# Patient Record
Sex: Male | Born: 1940 | Race: White | Hispanic: No | Marital: Married | Smoking: Former smoker
Health system: Southern US, Community
[De-identification: ages and names within clinical notes are randomized; demographics above are authoritative.]

## PROBLEM LIST (undated history)

## (undated) DIAGNOSIS — M2550 Pain in unspecified joint: Secondary | ICD-10-CM

## (undated) DIAGNOSIS — I714 Abdominal aortic aneurysm, without rupture, unspecified: Secondary | ICD-10-CM

## (undated) DIAGNOSIS — R519 Headache, unspecified: Secondary | ICD-10-CM

## (undated) DIAGNOSIS — IMO0001 Reserved for inherently not codable concepts without codable children: Secondary | ICD-10-CM

## (undated) DIAGNOSIS — M1990 Unspecified osteoarthritis, unspecified site: Secondary | ICD-10-CM

## (undated) DIAGNOSIS — R51 Headache: Secondary | ICD-10-CM

## (undated) DIAGNOSIS — I82409 Acute embolism and thrombosis of unspecified deep veins of unspecified lower extremity: Secondary | ICD-10-CM

## (undated) DIAGNOSIS — E785 Hyperlipidemia, unspecified: Secondary | ICD-10-CM

## (undated) DIAGNOSIS — J449 Chronic obstructive pulmonary disease, unspecified: Secondary | ICD-10-CM

## (undated) DIAGNOSIS — R42 Dizziness and giddiness: Secondary | ICD-10-CM

## (undated) HISTORY — DX: Abdominal aortic aneurysm, without rupture, unspecified: I71.40

## (undated) HISTORY — DX: Chronic obstructive pulmonary disease, unspecified: J44.9

## (undated) HISTORY — DX: Pain in unspecified joint: M25.50

## (undated) HISTORY — DX: Acute embolism and thrombosis of unspecified deep veins of unspecified lower extremity: I82.409

## (undated) HISTORY — DX: Hyperlipidemia, unspecified: E78.5

## (undated) HISTORY — DX: Abdominal aortic aneurysm, without rupture: I71.4

---

## 2001-06-17 ENCOUNTER — Emergency Department (HOSPITAL_COMMUNITY): Admission: EM | Admit: 2001-06-17 | Discharge: 2001-06-17 | Payer: Self-pay | Admitting: Emergency Medicine

## 2001-06-17 ENCOUNTER — Encounter: Payer: Self-pay | Admitting: Emergency Medicine

## 2001-08-27 ENCOUNTER — Emergency Department (HOSPITAL_COMMUNITY): Admission: RE | Admit: 2001-08-27 | Discharge: 2001-08-27 | Payer: Self-pay | Admitting: *Deleted

## 2003-07-15 ENCOUNTER — Inpatient Hospital Stay (HOSPITAL_COMMUNITY): Admission: EM | Admit: 2003-07-15 | Discharge: 2003-07-16 | Payer: Self-pay | Admitting: Emergency Medicine

## 2003-07-15 ENCOUNTER — Encounter: Payer: Self-pay | Admitting: Emergency Medicine

## 2003-12-04 DIAGNOSIS — I82409 Acute embolism and thrombosis of unspecified deep veins of unspecified lower extremity: Secondary | ICD-10-CM

## 2003-12-04 HISTORY — PX: FRACTURE SURGERY: SHX138

## 2003-12-04 HISTORY — DX: Acute embolism and thrombosis of unspecified deep veins of unspecified lower extremity: I82.409

## 2007-10-22 ENCOUNTER — Ambulatory Visit: Payer: Self-pay | Admitting: Vascular Surgery

## 2008-10-20 ENCOUNTER — Ambulatory Visit: Payer: Self-pay | Admitting: Vascular Surgery

## 2009-10-26 ENCOUNTER — Ambulatory Visit: Payer: Self-pay | Admitting: Vascular Surgery

## 2010-12-01 ENCOUNTER — Ambulatory Visit: Payer: Self-pay | Admitting: Vascular Surgery

## 2011-04-17 NOTE — Procedures (Signed)
DUPLEX ULTRASOUND OF ABDOMINAL AORTA   INDICATION:  Follow up abdominal aortic aneurysm   HISTORY:  Diabetes:  No  Cardiac:  No  Hypertension:  No  Smoking:  Yes  Connective Tissue Disorder:  Family History:  No  Previous Surgery:  No   DUPLEX EXAM:         AP (cm)                   TRANSVERSE (cm)  Proximal             1.84 cm                   1.80 cm  Mid                  3.03 cm                   3.06 cm  Distal               2.01 cm                   1.99 cm  Right Iliac          0.84 cm  Left Iliac           0.85 cm   PREVIOUS:  Date: 10/11/2006  AP:  2.9  TRANSVERSE:  2.7   IMPRESSION:  Fairly stable abdominal aortic aneurysm with an increase  from 2.9 cm in previous study to 3.06 cm today.   ___________________________________________  Janetta Hora. Fields, MD   AS/MEDQ  D:  10/22/2007  T:  10/23/2007  Job:  [id number]

## 2011-04-17 NOTE — Procedures (Signed)
DUPLEX ULTRASOUND OF ABDOMINAL AORTA   INDICATION:  Follow up abdominal aortic aneurysm.   HISTORY:  Diabetes:  No.  Cardiac:  No.  Hypertension:  No.  Smoking:  Yes.  Connective Tissue Disorder:  Family History:  No.  Previous Surgery:  No.   DUPLEX EXAM:         AP (cm)                   TRANSVERSE (cm)  Proximal             2.09 Cm                   2.12 cm  Mid                  3.25 cm                   3.28 cm  Distal               1.90 cm                   1.92 cm  Right Iliac          0.99 cm  Left Iliac           1.02 cm   PREVIOUS:  Date: 10/22/2007  AP:  3.03  TRANSVERSE:  3.06   IMPRESSION:  Abdominal aortic aneurysm appears stable with the largest  measurement of 3.25 cm X 3.28 cm.   ___________________________________________  Janetta Hora. Fields, MD   AS/MEDQ  D:  10/20/2008  T:  10/20/2008  Job:  [id number]

## 2011-04-17 NOTE — Procedures (Signed)
DUPLEX ULTRASOUND OF ABDOMINAL AORTA   INDICATION:  Followup abdominal aortic aneurysm.   HISTORY:  Diabetes:  No.  Cardiac:  No.  Hypertension:  No.  Smoking:  Yes.  Connective Tissue Disorder:  Family History:  No.  Previous Surgery:  No.   DUPLEX EXAM:         AP (cm)                   TRANSVERSE (cm)  Proximal             2.06 cm                   2.31 cm  Mid                  3.46 cm                   3.28 cm  Distal               3.37 cm                   3.56 cm  Right Iliac          1.35 cm                   1.47 cm  Left Iliac           1.04 cm                   1.07 cm   PREVIOUS:  Date:  10/26/2009  AP:  3.9  TRANSVERSE:  3.4   IMPRESSION:  Abdominal aortic aneurysm noted with largest measurement of  3.37 x 3.56 cm.   ___________________________________________  Janetta Hora. Darrick Penna, MD   EM/MEDQ  D:  12/01/2010  T:  12/01/2010  Job:  [id number]

## 2011-04-17 NOTE — Procedures (Signed)
DUPLEX ULTRASOUND OF ABDOMINAL AORTA   INDICATION:  Followup of abdominal aortic aneurysm.   HISTORY:  Diabetes:  No.  Cardiac:  No.  Hypertension:  No.  Smoking:  Yes.  Connective Tissue Disorder:  Family History:  No.  Previous Surgery:  No.   DUPLEX EXAM:         AP (cm)                   TRANSVERSE (cm)  Proximal             2.9 cm                    2.7 cm  Mid                  3.9 cm                    3.4 cm  Distal               3.2 cm                    3.3 cm  Right Iliac          1.3 cm                    1.05 cm  Left Iliac           1.01 cm                   1.03 cm   PREVIOUS:  Date:  AP:  3.25  TRANSVERSE:  3.28   IMPRESSION:  An abdominal aortic aneurysm with largest measurement 3.9 x  3.4 cm.   ___________________________________________  Janetta Hora. Fields, MD   CJ/MEDQ  D:  10/26/2009  T:  10/26/2009  Job:  [id number]

## 2011-04-20 NOTE — H&P (Signed)
NAME:  Reginald [name], Reginald [name] NO.:  [id number]   MEDICAL RECORD NO.:  [id number]                   PATIENT TYPE:  EMS   LOCATION:  [facility]                                 FACILITY:  [facility]   PHYSICIAN:  Wanda Plump, MD [facility]                 DATE OF BIRTH:  18-Jan-1941   DATE OF ADMISSION:  07/15/2003  DATE OF DISCHARGE:                                HISTORY & PHYSICAL   ATTENDING PHYSICIAN:  Wanda Plump, M.D.  [facility]   PRIMARY CARE PHYSICIAN:  Valetta Mole. Swords, M.D.  [facility]   CHIEF COMPLAINT:  Shortness of breath.   HISTORY OF PRESENT ILLNESS:  Reginald [name] is a 70 year old white male who was  admitted to [facility] ER with acute shortness of breath.  The patient  stated that he was having a cold for the last few days characterized by  cough, yellow sputum, and low grade temperature.  Earlier today at 12:30  a.m. he had a sudden episode of severe shortness of breath.  EMS was called  and he was found to be wheezing.  On his way to the hospital, he got two  albuterol treatments and nitroglycerin.  By the time he arrived at the  emergency room, he was feeling better.  He specifically denies any chest  pain to me.  At present, he feels overall better but feels he is very  congested in his chest.   PAST MEDICAL HISTORY:  1. He had an ankle fracture three years ago followed by right lower     extremity DVT.  After that he was left with chronic right leg swelling.  2. He has never been officially diagnosed with diabetes, hypertension, or     COPD.   CURRENT MEDICATIONS:  None.   SOCIAL HISTORY:  He smokes one pack a day of tobacco and does not drink.   FAMILY HISTORY:  Noncontributory at this time.   REVIEW OF SYSTEMS:  Again, he is having a cold for the last few days.  He  did have some sore throat the first day of the cold.  No nausea, vomiting,  diarrhea.  No abdominal pain.  No hematuria.  He is coughing again some  yellow sputum.   ALLERGIES:  No known drug  allergies.   PHYSICAL EXAMINATION:  GENERAL:  The patient is alert and oriented.  He has  some mild respiratory distress.  He is obviously wheezing and coughing.  VITAL SIGNS:  Afebrile, respirations 20, pulse 77, blood pressure 90/61, O2  saturation 94% on 2 L per nasal cannula.  HEENT:  Ear:  Tympanic membrane on the right side is slightly bulging.  The  left is normal.  Oropharynx is normal.  Sinuses nontender.  LUNGS:  He has diffuse wheezes and rhonchi.  CARDIOVASCULAR:  Regular rate and rhythm.  ABDOMEN:  Nondistended, soft with good bowel sounds and  no organomegaly.  EXTREMITIES:  The right calf is slightly bigger than the left but no pitting  edema.   LABORATORY DATA:  ABGs show pH 7.4, CO2 35, bicarb 22, and O2 was low at 59.  CBC shows white count 9.6, normal diff, hemoglobin 15.8, platelets 213.  LFTs are normal.  BNP is less than 30.  Creatinine 1, BUN 13, blood sugar  142, potassium 3.6, sodium 136.  First set of cardiac enzymes show CK 213  which is elevated, MB 8.4 which is elevated, and negative Troponin.   CT of the chest and legs shows no evidence of DVT or pulmonary embolism.  He  does have COPD, probably early alveolitis at the bases but no obvious  consolidation.  Chest x-ray shows COPD.   ASSESSMENT/PLAN:  1. Reginald [name] has been admitted with an acute episode of dyspnea and in light     of the negative CT of the chest and legs, this is most likely     bronchospasm and a chronic obstructive pulmonary disease exacerbation.     He may be developing a pneumonia as well.  I am planning to admit him to     the hospital for intravenous antibiotics and aggressive pulmonary toilet     as well as oxygen supplementation.  2. He has a slightly elevated CK and we will check serial cardiac enzymes.  3. Will provide deep venous thrombosis prophylaxis with Lovenox     subcutaneously.                                                Wanda Plump, MD [facility]    JEP/MEDQ  D:   07/15/2003  T:  07/15/2003  Job:  [id number]

## 2011-04-20 NOTE — Discharge Summary (Signed)
   NAME:  Reginald [name], Reginald [name] NO.:  [id number]   MEDICAL RECORD NO.:  [id number]                   PATIENT TYPE:  INP   LOCATION:  5740                                 FACILITY:  [facility]   PHYSICIAN:  Rene Paci, M.D. [facility]          DATE OF BIRTH:  Feb 09, 1941   DATE OF ADMISSION:  07/15/2003  DATE OF DISCHARGE:  07/16/2003                                 DISCHARGE SUMMARY   DISCHARGE DIAGNOSES:  1. Dyspnea.  2. Bronchospasm.  3. Bronchitis.   BRIEF HISTORY:  Reginald [name] is a 70 year old white male who complained of an  upper respiratory infection starting last Sunday.  The patient's symptoms  have progressed including headache and productive cough.  He developed  sudden onset of shortness of breath at 12:30 a.m. on the day of admission.  EMS was called.  He was noted to be wheezing.  He received albuterol x 2 and  nitroglycerin prior to arrival to the emergency room.  In the emergency  department, he did feel improved.   PAST MEDICAL HISTORY:  Remote history of ankle fracture associated with DVT  at that time.   HOSPITAL COURSE:  PULMONARY AND DYSPNEA:  The patient was admitted with acute dyspnea felt to  be likely secondary to bronchospasm from probable underlying COPD.  However,  the patient did have a CT of the chest which was negative for PE, congestive  heart failure, or infiltrates.  He does have some evidence of scarring on  the bilateral bases.  The patient states that he does still smoke.  He has  not smoked since the  Sunday prior to this admission, however, The patient  was treated with albuterol, Atrovent nebulizers as well as antibiotics for  probable underlying bronchitis.  Currently, the patient is maintaining O2  saturation of 91% on room air and is symptomatically improved.  The patient  is interested in smoking cessation.  He states he will talk with Dr. Cato Mulligan  next week about possibly using Wellbutrin.   LABORATORY DATA:  At  discharge, CMET was essentially normal.   DISCHARGE MEDICATIONS:  1. Avalox 400 mg daily for 8 days.  2. Guaifenesin 600 mg 2 tablets q.12h. for 5 days.  3. Albuterol nebulizer with Atrovent four times a day for 5 days.   FOLLOW UP:  Dr. Cato Mulligan Tuesday, August 17, at 10:45 a.m.      Cornell Barman, P.A. [facility]                  Rene Paci, M.D. [facility]   LC/MEDQ  D:  07/16/2003  T:  07/17/2003  Job:  [id number]   cc:   Valetta Mole. Swords, M.D. [facility]

## 2011-05-24 ENCOUNTER — Ambulatory Visit: Payer: Self-pay | Admitting: Vascular Surgery

## 2011-11-01 ENCOUNTER — Encounter: Payer: Self-pay | Admitting: Vascular Surgery

## 2011-11-29 ENCOUNTER — Ambulatory Visit (INDEPENDENT_AMBULATORY_CARE_PROVIDER_SITE_OTHER): Payer: Medicare Other | Admitting: Vascular Surgery

## 2011-11-29 ENCOUNTER — Encounter: Payer: Self-pay | Admitting: Vascular Surgery

## 2011-11-29 ENCOUNTER — Encounter (INDEPENDENT_AMBULATORY_CARE_PROVIDER_SITE_OTHER): Payer: Medicare Other | Admitting: *Deleted

## 2011-11-29 VITALS — BP 133/83 | HR 67 | Resp 16 | Ht 72.0 in | Wt 205.0 lb

## 2011-11-29 DIAGNOSIS — I714 Abdominal aortic aneurysm, without rupture: Secondary | ICD-10-CM

## 2011-11-29 NOTE — Progress Notes (Signed)
[facility] [place] HISTORY AND PHYSICAL   History of Present Illness:  Patient is a 70 y.o. year old male who presents for follow-up evaluation of AAA.  The patient denies new abdominal or back pain.  The patient's atherosclerotic risk factors remain hypertension, COPD, smoking.  These are all currently stable and followed by his primary care physician.  The patients AAA was 2.9cm  on intial evaluation.  Past Medical History  Diagnosis Date  . COPD (chronic obstructive pulmonary disease)   . Joint pain   . Bronchitis   . Productive cough   . Asthma   . Blood clot in vein   . AAA (abdominal aortic aneurysm)   . Hypertension     History reviewed. No pertinent past surgical history.  Review of Systems:  Neurologic: denies symptoms of TIA, amaurosis, or stroke Cardiac:denies shortness of breath or chest pain Pulmonary: denies cough or wheeze  Social History History  Substance Use Topics  . Smoking status: Current Everyday Smoker -- 0.5 packs/day    Types: Cigarettes  . Smokeless tobacco: Not on file  . Alcohol Use: No    Allergies  Not on File   Current Outpatient Prescriptions  Medication Sig Dispense Refill  . albuterol (PROVENTIL) (2.5 MG/3ML) 0.083% nebulizer solution Take 2.5 mg by nebulization every 6 (six) hours as needed.        Marland Kitchen atorvastatin (LIPITOR) 10 MG tablet Take 20 mg by mouth daily.        Marland Kitchen doxazosin (CARDURA) 4 MG tablet Take 4 mg by mouth at bedtime.        . finasteride (PROPECIA) 1 MG tablet Take 5 mg by mouth daily.        . fluticasone (FLONASE) 50 MCG/ACT nasal spray Place 2 sprays into the nose daily.        . fluticasone (FLOVENT HFA) 44 MCG/ACT inhaler Inhale 1 puff into the lungs 2 (two) times daily.        Marland Kitchen gemfibrozil (LOPID) 600 MG tablet Take 600 mg by mouth 2 (two) times daily before a meal.        . hydrocodone-acetaminophen (LORCET-HD) 5-500 MG per capsule Take 1 capsule by mouth every 4 (four) hours as needed.           Physical Examination  Filed Vitals:   11/29/11 1130  BP: 133/83  Pulse: 67  Resp: 16  Height: 6' (1.829 m)  Weight: 205 lb (92.987 kg)  SpO2: 97%    Body mass index is 27.80 kg/(m^2).  General:  Alert and oriented, no acute distress HEENT: Normal Neck: No bruit or JVD Pulmonary: Clear to auscultation bilaterally Cardiac: Regular Rate and Rhythm without murmur Abdomen: Soft, non-tender, non-distended, normal bowel sounds, vaguely pulsatile mass in epigastrium  DATA: Patient had a repeat aortic ultrasound today which shows aneurysm diameter is currently 3.8 cm in diameter compared to 3.56 cm in diameter one year ago   ASSESSMENT: Asymptomatic small abdominal aortic aneurysm with minimal growth   PLAN: He will have a followup aortic ultrasound in one year. If the aorta is greater than 4 cm at that time we will go to every 6 months. He will see me in 2 years.  Fabienne Bruns, MD [facility] [place] Office: [phone] Pager: [phone]

## 2011-12-14 NOTE — Procedures (Unsigned)
DUPLEX ULTRASOUND OF ABDOMINAL AORTA  INDICATION:  Abdominal aortic aneurysm.  HISTORY: Diabetes:  No. Cardiac:  No. Hypertension:  No. Smoking:  Yes. Connective Tissue Disorder: Family History:  No. Previous Surgery:  No.  DUPLEX EXAM:         AP (cm)                   TRANSVERSE (cm) Proximal             Not visualized            Not visualized Mid                  2.0 cm                    2.2 cm Distal               3.6 cm                    3.8 cm Right Iliac          Not visualized            Not visualized Left Iliac           Not visualized            Not visualized  PREVIOUS:  Date: 12/01/2010  AP:  3.37  TRANSVERSE:  3.56  IMPRESSION: 1. Aneurysmal dilatation of the distal abdominal aorta with no     significant change in maximal diameter when compared to the     previous examination on 12/01/2010. 2. Unable to adequately visualize the proximal abdominal aorta and     bilateral common iliac arteries due to overlying bowel gas     patterns.  The patient was not n.p.o. for this examination.  ___________________________________________ Janetta Hora Fields, MD  CH/MEDQ  D:  11/30/2011  T:  11/30/2011  Job:  [id number]

## 2012-11-25 ENCOUNTER — Other Ambulatory Visit: Payer: Self-pay | Admitting: *Deleted

## 2012-11-25 DIAGNOSIS — I714 Abdominal aortic aneurysm, without rupture: Secondary | ICD-10-CM

## 2012-11-27 ENCOUNTER — Encounter: Payer: Self-pay | Admitting: Neurosurgery

## 2012-11-28 ENCOUNTER — Encounter: Payer: Self-pay | Admitting: Neurosurgery

## 2012-11-28 ENCOUNTER — Encounter (INDEPENDENT_AMBULATORY_CARE_PROVIDER_SITE_OTHER): Payer: Medicare Other | Admitting: *Deleted

## 2012-11-28 ENCOUNTER — Ambulatory Visit (INDEPENDENT_AMBULATORY_CARE_PROVIDER_SITE_OTHER): Payer: Medicare Other | Admitting: Neurosurgery

## 2012-11-28 VITALS — BP 139/85 | HR 63 | Resp 16 | Ht 70.5 in | Wt 197.0 lb

## 2012-11-28 DIAGNOSIS — I714 Abdominal aortic aneurysm, without rupture, unspecified: Secondary | ICD-10-CM

## 2012-11-28 NOTE — Addendum Note (Signed)
Addended by: Sharee Pimple on: 11/28/2012 11:01 AM   Modules accepted: Orders

## 2012-11-28 NOTE — Progress Notes (Signed)
[facility] [place] AAA/Carotid Office Note  CC: AAA surveillance Referring Physician: Fields  History of Present Illness: 71 year old male patient of Dr. Darrick Penna with known AAA. The patient has no history of intervention. The patient denies any unusual abdominal or back pain. The patient denies any new medical diagnoses or recent surgery.  Past Medical History  Diagnosis Date  . COPD (chronic obstructive pulmonary disease)   . Joint pain   . Bronchitis   . Productive cough   . Asthma   . Blood clot in vein   . AAA (abdominal aortic aneurysm)   . Hypertension   . DVT (deep venous thrombosis) 2005    Right  leg    ROS: [x]  Positive   [ ]  Denies    General: [ ]  Weight loss, [ ]  Fever, [ ]  chills Neurologic: [ ]  Dizziness, [ ]  Blackouts, [ ]  Seizure [ ]  Stroke, [ ]  "Mini stroke", [ ]  Slurred speech, [ ]  Temporary blindness; [ ]  weakness in arms or legs, [ ]  Hoarseness Cardiac: [ ]  Chest pain/pressure, [ ]  Shortness of breath at rest [ ]  Shortness of breath with exertion, [ ]  Atrial fibrillation or irregular heartbeat Vascular: [ ]  Pain in legs with walking, [ ]  Pain in legs at rest, [ ]  Pain in legs at night,  [ ]  Non-healing ulcer, [ ]  Blood clot in vein/DVT,   Pulmonary: [ ]  Home oxygen, [ ]  Productive cough, [ ]  Coughing up blood, [ ]  Asthma,  [ ]  Wheezing Musculoskeletal:  [ ]  Arthritis, [ ]  Low back pain, [ ]  Joint pain Hematologic: [ ]  Easy Bruising, [ ]  Anemia; [ ]  Hepatitis Gastrointestinal: [ ]  Blood in stool, [ ]  Gastroesophageal Reflux/heartburn, [ ]  Trouble swallowing Urinary: [ ]  chronic Kidney disease, [ ]  on HD - [ ]  MWF or [ ]  TTHS, [ ]  Burning with urination, [ ]  Difficulty urinating Skin: [ ]  Rashes, [ ]  Wounds Psychological: [ ]  Anxiety, [ ]  Depression   Social History History  Substance Use Topics  . Smoking status: Current Every Day Smoker -- 0.5 packs/day    Types: Cigarettes  . Smokeless tobacco: Never Used  . Alcohol Use: No     Family History Family History  Problem Relation Age of Onset  . Hyperlipidemia Mother   . Hypertension Mother   . Cancer Father   . Hyperlipidemia Father   . Hypertension Father   . Hyperlipidemia Sister   . Hypertension Sister   . Heart disease Brother     Heart Disease before age 3  . Heart attack Brother     x's 2  . Hyperlipidemia Brother   . Hypertension Brother     Not on File  Current Outpatient Prescriptions  Medication Sig Dispense Refill  . albuterol (PROVENTIL) (2.5 MG/3ML) 0.083% nebulizer solution Take 2.5 mg by nebulization every 6 (six) hours as needed.        Marland Kitchen aspirin 325 MG tablet Take 325 mg by mouth every morning.      Marland Kitchen atorvastatin (LIPITOR) 10 MG tablet Take 20 mg by mouth daily.        . clobetasol cream (TEMOVATE) 0.05 % as needed.      . doxazosin (CARDURA) 4 MG tablet Take 4 mg by mouth at bedtime.        . finasteride (PROPECIA) 1 MG tablet Take 5 mg by mouth daily.        . finasteride (PROSCAR) 5 MG tablet daily.      Marland Kitchen  fluticasone (FLONASE) 50 MCG/ACT nasal spray Place 2 sprays into the nose daily.        . fluticasone (FLOVENT HFA) 44 MCG/ACT inhaler Inhale 2 puffs into the lungs at bedtime.       Marland Kitchen gemfibrozil (LOPID) 600 MG tablet Take 600 mg by mouth 2 (two) times daily before a meal.        . hydrocodone-acetaminophen (LORCET-HD) 5-500 MG per capsule Take 1 capsule by mouth every 4 (four) hours as needed.          Physical Examination  Filed Vitals:   11/28/12 0936  BP: 139/85  Pulse: 63  Resp: 16    Body mass index is 27.87 kg/(m^2).  General:  WDWN in NAD Gait: Normal HEENT: WNL Eyes: Pupils equal Pulmonary: normal non-labored breathing , without Rales, rhonchi,  wheezing Cardiac: RRR, without  Murmurs, rubs or gallops; Abdomen: soft, NT, no masses Skin: no rashes, ulcers noted  Vascular Exam Pulses: Palpable femoral and lower extremity pulses bilaterally Carotid bruits: Carotid pulses to auscultation no bruits are  heard, no abdominal mass is palpated Extremities without ischemic changes, no Gangrene , no cellulitis; no open wounds;  Musculoskeletal: no muscle wasting or atrophy   Neurologic: A&O X 3; Appropriate Affect ; SENSATION: normal; MOTOR FUNCTION:  moving all extremities equally. Speech is fluent/normal  Non-Invasive Vascular Imaging AAA duplex today shows a maximum diameter of 4.2 AP by 3.6 transverse, previous exam one year ago was 3.6 x 3.8.  ASSESSMENT/PLAN: Slight increase in AAA diameter that is asymptomatic, the patient will followup in one year with repeat AAA duplex. The patient knows the signs and symptoms of rupture and knows to call 911 should that occur.  Lauree Chandler ANP   Clinic MD: Imogene Burn

## 2012-12-01 ENCOUNTER — Emergency Department (HOSPITAL_COMMUNITY)
Admission: EM | Admit: 2012-12-01 | Discharge: 2012-12-01 | Disposition: A | Discharge status: Home / Self Care | Payer: Medicare Other | Attending: Emergency Medicine | Admitting: Emergency Medicine

## 2012-12-01 ENCOUNTER — Emergency Department (HOSPITAL_COMMUNITY): Payer: Medicare Other

## 2012-12-01 ENCOUNTER — Encounter (HOSPITAL_COMMUNITY): Payer: Self-pay | Admitting: Emergency Medicine

## 2012-12-01 DIAGNOSIS — Z86718 Personal history of other venous thrombosis and embolism: Secondary | ICD-10-CM | POA: Insufficient documentation

## 2012-12-01 DIAGNOSIS — J449 Chronic obstructive pulmonary disease, unspecified: Secondary | ICD-10-CM | POA: Insufficient documentation

## 2012-12-01 DIAGNOSIS — F17200 Nicotine dependence, unspecified, uncomplicated: Secondary | ICD-10-CM | POA: Insufficient documentation

## 2012-12-01 DIAGNOSIS — J45909 Unspecified asthma, uncomplicated: Secondary | ICD-10-CM | POA: Insufficient documentation

## 2012-12-01 DIAGNOSIS — J4489 Other specified chronic obstructive pulmonary disease: Secondary | ICD-10-CM | POA: Insufficient documentation

## 2012-12-01 DIAGNOSIS — I10 Essential (primary) hypertension: Secondary | ICD-10-CM | POA: Insufficient documentation

## 2012-12-01 DIAGNOSIS — Z79899 Other long term (current) drug therapy: Secondary | ICD-10-CM | POA: Insufficient documentation

## 2012-12-01 DIAGNOSIS — R51 Headache: Secondary | ICD-10-CM | POA: Insufficient documentation

## 2012-12-01 MED ORDER — HYDROMORPHONE HCL 4 MG PO TABS: 4.0000 mg | ORAL_TABLET | ORAL | Status: DC | PRN

## 2012-12-01 MED ORDER — FENTANYL CITRATE 0.05 MG/ML IJ SOLN
100.0000 ug | Freq: Once | INTRAMUSCULAR | Status: AC
  Administered 2012-12-01: 100 ug via INTRAVENOUS
  Filled 2012-12-01: qty 2

## 2012-12-01 NOTE — ED Notes (Addendum)
Per EMS pt came from home where he had sudden onset of headache on the right side and tremors Pt does have history of cluster headaches. Pt has 20g in left hand. Pt was Sinus Tachy in route per EMS. Pt states that he has does have abdominal aortic aneurysm that was just checked on Tuesday last week.

## 2012-12-01 NOTE — ED Notes (Signed)
Patient transported to CT 

## 2012-12-01 NOTE — ED Notes (Signed)
Pt states cluster headaches move from front to back alternating back and forth.  Pt states that pain is stronger when in the front of the head than the posterior of head.

## 2012-12-01 NOTE — ED Provider Notes (Signed)
History     CSN: [id number]  Arrival date & time 12/01/12  1514   First MD Initiated Contact with Patient 12/01/12 1535      Chief Complaint  Patient presents with  . Headache    (Consider location/radiation/quality/duration/timing/severity/associated sxs/prior treatment) HPI This 71 year old male gets intermittent right-sided sudden sharp stabbing headaches the last less than 1 minute each but occur multiple times per hour into the frequency of occurring once every 2 months or so and lasting 1-2 days or he will have multiple headaches per hour over the course of 2 days or so and he will not have headaches again until about 2 months later. He has had this pattern for years and is followed by a doctor and was prescribed hydrocodone for the patient's headaches. Patient states he usually moderately severe. Today with her last 2 hours the patient had sudden onset of at least one dozen or more right-sided sharp stabbing headaches more severe than usual. Instead of being moderately severe they were severe. He has no associated symptoms and no radiation. He is no change in speech vision swallowing or understanding and no weakness numbness or incoordination. He also has no neck pain back pain chest pain shortness breath abdominal pain. She is no lightheadedness no vertigo no trauma. There is no treatment prior to arrival today other than taking 2 aspirin. Because his headaches were stronger than usual but the same location and without associated symptoms he came to the ED for evaluation. Past Medical History  Diagnosis Date  . COPD (chronic obstructive pulmonary disease)   . Joint pain   . Bronchitis   . Productive cough   . Asthma   . Blood clot in vein   . AAA (abdominal aortic aneurysm)   . Hypertension   . DVT (deep venous thrombosis) 2005    Right  leg    Past Surgical History  Procedure Date  . Fracture surgery 2005    Right heel     Family History  Problem Relation Age of Onset    . Hyperlipidemia Mother   . Hypertension Mother   . Cancer Father   . Hyperlipidemia Father   . Hypertension Father   . Hyperlipidemia Sister   . Hypertension Sister   . Heart disease Brother     Heart Disease before age 65  . Heart attack Brother     x's 2  . Hyperlipidemia Brother   . Hypertension Brother     History  Substance Use Topics  . Smoking status: Current Every Day Smoker -- 0.5 packs/day    Types: Cigarettes  . Smokeless tobacco: Never Used  . Alcohol Use: No      Review of Systems 10 Systems reviewed and are negative for acute change except as noted in the HPI. Allergies  Review of patient's allergies indicates no known allergies.  Home Medications   Current Outpatient Rx  Name  Route  Sig  Dispense  Refill  . ALBUTEROL SULFATE HFA 108 (90 BASE) MCG/ACT IN AERS   Inhalation   Inhale 2 puffs into the lungs every 6 (six) hours as needed. For shortness of breath.         . ASPIRIN 325 MG PO TABS   Oral   Take 325 mg by mouth every morning.         . ATORVASTATIN CALCIUM 20 MG PO TABS   Oral   Take 20 mg by mouth at bedtime.         Marland Kitchen  CLOBETASOL PROPIONATE 0.05 % EX CREA   Topical   Apply 1 application topically 2 (two) times daily as needed. For dry skin.         Marland Kitchen DOXAZOSIN MESYLATE 4 MG PO TABS   Oral   Take 4 mg by mouth at bedtime.          Marland Kitchen FINASTERIDE 5 MG PO TABS   Oral   Take 5 mg by mouth at bedtime.          Marland Kitchen FLUTICASONE PROPIONATE 50 MCG/ACT NA SUSP   Nasal   Place 2 sprays into the nose daily.           Marland Kitchen FLUTICASONE PROPIONATE  HFA 44 MCG/ACT IN AERO   Inhalation   Inhale 2 puffs into the lungs at bedtime.          Marland Kitchen GEMFIBROZIL 600 MG PO TABS   Oral   Take 600 mg by mouth 2 (two) times daily.          Marland Kitchen HYDROCODONE-ACETAMINOPHEN 5-500 MG PO TABS   Oral   Take 1 tablet by mouth every 6 (six) hours as needed. For pain.         Marland Kitchen HYDROMORPHONE HCL 4 MG PO TABS   Oral   Take 1 tablet (4 mg total)  by mouth every 4 (four) hours as needed for pain.   6 tablet   0     BP 110/63  Pulse 95  Temp 98.1 F (36.7 C) (Oral)  Resp 19  SpO2 96%  Physical Exam  Nursing note and vitals reviewed. Constitutional:       Awake, alert, nontoxic appearance with baseline speech for patient.  HENT:  Head: Atraumatic.  Mouth/Throat: No oropharyngeal exudate.  Eyes: EOM are normal. Pupils are equal, round, and reactive to light. Right eye exhibits no discharge. Left eye exhibits no discharge.  Neck: Neck supple.  Cardiovascular: Normal rate and regular rhythm.   No murmur heard. Pulmonary/Chest: Effort normal and breath sounds normal. No stridor. No respiratory distress. He has no wheezes. He has no rales. He exhibits no tenderness.  Abdominal: Soft. Bowel sounds are normal. He exhibits no mass. There is no tenderness. There is no rebound.  Musculoskeletal: He exhibits no edema and no tenderness.       Baseline ROM, moves extremities with no obvious new focal weakness.  Lymphadenopathy:    He has no cervical adenopathy.  Neurological: He is alert.       Awake, alert, cooperative and aware of situation; motor strength bilaterally; sensation normal to light touch bilaterally; peripheral visual fields full to confrontation; no facial asymmetry; tongue midline; major cranial nerves appear intact; no pronator drift, normal finger to nose bilaterally  Skin: No rash noted.  Psychiatric: He has a normal mood and affect.    ED Course  Procedures (including critical care time) Normal gait in ED. Patient / Family / Caregiver informed of clinical course, understand medical decision-making process, and agree with plan. Labs Reviewed - No data to display Ct Head Wo Contrast  12/01/2012  *RADIOLOGY REPORT*  Clinical Data: Severe headache  CT HEAD WITHOUT CONTRAST  Technique:  Contiguous axial images were obtained from the base of the skull through the vertex without contrast.  Comparison: None.  Findings:  There is extensive patchy and confluent low density affecting the cerebral hemispheric white matter most likely indicative of chronic small vessel disease.  There is an old cortical infarction in the insular region on  the left.  A small acute or subacute infarction could be hidden amongst these extensive chronic changes.  No evidence of mass lesion, hemorrhage, hydrocephalus or extra-axial collection.  No calvarial abnormality. Sinuses, middle ears and mastoids are clear.  IMPRESSION: Extensive chronic appearing small vessel ischemic changes throughout the cerebral hemispheric white matter.  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  No identifiably acute finding.   Original Report Authenticated By: Paulina Fusi, M.D.      1. Headache       MDM  I doubt any other EMC precluding discharge at this time including, but not necessarily limited to the following:SAH, CVA.        Hurman Horn, MD 12/02/12 1225

## 2013-06-18 ENCOUNTER — Other Ambulatory Visit: Payer: Self-pay | Admitting: Family Medicine

## 2013-06-18 DIAGNOSIS — R109 Unspecified abdominal pain: Secondary | ICD-10-CM

## 2013-06-24 ENCOUNTER — Other Ambulatory Visit: Payer: Medicare Other

## 2013-12-10 ENCOUNTER — Other Ambulatory Visit: Payer: Medicare Other

## 2013-12-10 ENCOUNTER — Ambulatory Visit: Payer: Medicare Other | Admitting: Family

## 2013-12-10 ENCOUNTER — Encounter: Payer: Self-pay | Admitting: Family

## 2013-12-11 ENCOUNTER — Encounter: Payer: Self-pay | Admitting: Family

## 2013-12-11 ENCOUNTER — Ambulatory Visit (HOSPITAL_COMMUNITY)
Admission: RE | Admit: 2013-12-11 | Discharge: 2013-12-11 | Disposition: A | Discharge status: Home / Self Care | Payer: Medicare Other | Source: Ambulatory Visit | Attending: Family | Admitting: Family

## 2013-12-11 ENCOUNTER — Ambulatory Visit (INDEPENDENT_AMBULATORY_CARE_PROVIDER_SITE_OTHER): Payer: Medicare Other | Admitting: Family

## 2013-12-11 VITALS — BP 118/78 | HR 74 | Resp 16 | Ht 71.0 in | Wt 213.0 lb

## 2013-12-11 DIAGNOSIS — I714 Abdominal aortic aneurysm, without rupture, unspecified: Secondary | ICD-10-CM

## 2013-12-11 NOTE — Patient Instructions (Signed)

## 2013-12-11 NOTE — Progress Notes (Signed)
[facility] [place]  Established Abdominal Aortic Aneurysm  History of Present Illness  Reginald [name] is a 73 y.o. (03-26-1941) male patient of Dr. Oneida Alar who presents with chief complaint: follow up for AAA. Previous studies demonstrate an AAA, measuring 4.2 cm a year ago.  The patient does have chronic low back pain since he had a compound fracture of lumbar spine 3 years ago, denies abdominal pain.  The patient is not a smoker. The patient denies claudication in legs with walking , denies non-healing wounds. The patient denies history of stroke or TIA symptoms. Patient states that the statins "suck the life out of me". He has started having cluster headaches in the last 10 years, states his PCP is helping him with this, takes a triptan or prednisone prn. He reports history of DVT form right ankle to just above the right knee, after right ankle fracture about 10 years ago.  Pt Diabetic: Yes Pt smoker: former smoker, quit in 2013  Past Medical History  Diagnosis Date  . COPD (chronic obstructive pulmonary disease)   . Joint pain   . Bronchitis   . Productive cough   . Asthma   . Blood clot in vein   . AAA (abdominal aortic aneurysm)   . Hypertension   . DVT (deep venous thrombosis) 2005    Right  leg   Past Surgical History  Procedure Laterality Date  . Fracture surgery  2005    Right heel    Social History History   Social History  . Marital Status: Married    Spouse Name: N/A    Number of Children: N/A  . Years of Education: N/A   Occupational History  . Not on file.   Social History Main Topics  . Smoking status: Former Smoker -- 0.50 packs/day    Types: Cigarettes    Quit date: 01/12/2012  . Smokeless tobacco: Never Used     Comment: Pt smokes the [employer]  . Alcohol Use: No  . Drug Use: No  . Sexual Activity: Not on file   Other Topics Concern  . Not on file   Social History Narrative  . No narrative on file   Family  History Family History  Problem Relation Age of Onset  . Hyperlipidemia Mother   . Hypertension Mother   . Cancer Father   . Hyperlipidemia Father   . Hypertension Father   . Hyperlipidemia Sister   . Hypertension Sister   . Heart disease Brother     Heart Disease before age 67  . Heart attack Brother     x's 2  . Hyperlipidemia Brother   . Hypertension Brother     Current Outpatient Prescriptions on File Prior to Visit  Medication Sig Dispense Refill  . albuterol (PROVENTIL HFA;VENTOLIN HFA) 108 (90 BASE) MCG/ACT inhaler Inhale 2 puffs into the lungs every 6 (six) hours as needed. For shortness of breath.      Marland Kitchen aspirin 325 MG tablet Take 325 mg by mouth every morning.      Marland Kitchen atorvastatin (LIPITOR) 20 MG tablet Take 20 mg by mouth at bedtime.      . clobetasol cream (TEMOVATE) 5.17 % Apply 1 application topically 2 (two) times daily as needed. For dry skin.      Marland Kitchen doxazosin (CARDURA) 4 MG tablet Take 4 mg by mouth at bedtime.       . finasteride (PROSCAR) 5 MG tablet Take 5 mg by mouth at bedtime.       Marland Kitchen  fluticasone (FLONASE) 50 MCG/ACT nasal spray Place 2 sprays into the nose daily.        . fluticasone (FLOVENT HFA) 44 MCG/ACT inhaler Inhale 2 puffs into the lungs at bedtime.       Marland Kitchen gemfibrozil (LOPID) 600 MG tablet Take 600 mg by mouth 2 (two) times daily.       Marland Kitchen HYDROcodone-acetaminophen (VICODIN) 5-500 MG per tablet Take 1 tablet by mouth every 6 (six) hours as needed. For pain.      Marland Kitchen HYDROmorphone (DILAUDID) 4 MG tablet Take 1 tablet (4 mg total) by mouth every 4 (four) hours as needed for pain.  6 tablet  0   No current facility-administered medications on file prior to visit.   No Known Allergies  ROS: See HPI for pertinent positives and negatives.  Physical Examination  Filed Vitals:   12/11/13 0917  BP: 118/78  Pulse: 74  Resp: 16   Filed Weights   12/11/13 0917  Weight: 213 lb (96.616 kg)   Body mass index is 29.72 kg/(m^2).  General: A&O x 3,  male.  Pulmonary: Sym exp, good air movt, CTAB, no rales, rhonchi, or wheezing.   Cardiac: RRR, Nl S1, S2, no Murmurs, rubs or gallops.  Carotid Bruits Left Right   Negative Negative   Aorta is is not palpable. Radial pulses are 3+ palpable and =                          VASCULAR EXAM:                                                                                                         LE Pulses LEFT RIGHT       FEMORAL   palpable   palpable        POPLITEAL  not palpable   not palpable       POSTERIOR TIBIAL   palpable    palpable        DORSALIS PEDIS      ANTERIOR TIBIAL  palpable   palpable      Gastrointestinal: soft, NTND, -G/R, - HSM, - masses, - CVAT B.  Musculoskeletal: M/S 5/5 throughout, Extremities without ischemic changes. Moderate kyphosis. Right ankle bony deformity and enlargement noted medially, bilateral bunions. Moderate venous stasis skin changes noted at right medial ankle area.  Neurologic: CN 2-12 intact, Pain and light touch intact in extremities, Motor exam as listed above.  Non-Invasive Vascular Imaging  AAA Duplex (12/11/2013) 3.56 cm on December 01, 2010  Previous size: 4.2 cm (Date: 11/28/12)  Current size:  4.57 cm (Date: 12/11/2013)  Medical Decision Making  The patient is a 74 y.o. male who presents with asymptomatic AAA that has slowly increased in size over the last 3 years.   Based on this patient's exam and diagnostic studies, the patient will follow up in 6 months with the following studies: AAA Duplex.  The threshold for repair is AAA size > 5.5 cm, growth > 1 cm/yr, and symptomatic status.  I  emphasized the importance of maximal medical management including strict control of blood pressure, blood glucose, and lipid levels, antiplatelet agents, obtaining regular exercise, and continued cessation of smoking.   The patient was given information about AAA including signs, symptoms, treatment, and how to minimize the risk of  enlargement and rupture of aneurysms.    The patient was advised to call 911 should the patient experience sudden onset abdominal or back pain.   Thank you for allowing [place] to participate in this patient's care.  Clemon Chambers, RN, MSN, FNP-C [facility] Office: [phone]  Clinic Physician: Bridgett Larsson  12/11/2013, 9:18 AM

## 2014-06-11 ENCOUNTER — Other Ambulatory Visit (HOSPITAL_COMMUNITY): Payer: Medicare Other

## 2014-06-11 ENCOUNTER — Ambulatory Visit: Payer: Medicare Other | Admitting: Family

## 2014-06-17 ENCOUNTER — Encounter: Payer: Self-pay | Admitting: Family

## 2014-06-18 ENCOUNTER — Ambulatory Visit (HOSPITAL_COMMUNITY)
Admission: RE | Admit: 2014-06-18 | Discharge: 2014-06-18 | Disposition: A | Discharge status: Home / Self Care | Payer: Medicare Other | Source: Ambulatory Visit | Attending: Family | Admitting: Family

## 2014-06-18 ENCOUNTER — Ambulatory Visit (INDEPENDENT_AMBULATORY_CARE_PROVIDER_SITE_OTHER): Payer: Medicare Other | Admitting: Family

## 2014-06-18 ENCOUNTER — Encounter: Payer: Self-pay | Admitting: Family

## 2014-06-18 VITALS — BP 131/81 | HR 70 | Resp 16 | Ht 70.5 in | Wt 216.0 lb

## 2014-06-18 DIAGNOSIS — I714 Abdominal aortic aneurysm, without rupture, unspecified: Secondary | ICD-10-CM | POA: Diagnosis present

## 2014-06-18 NOTE — Addendum Note (Signed)
Addended by: Mena Goes on: 06/18/2014 11:03 AM   Modules accepted: Orders

## 2014-06-18 NOTE — Progress Notes (Signed)
[facility] [place]  Established Abdominal Aortic Aneurysm  History of Present Illness  Reginald [name] is a 73 y.o. (Apr 14, 1941) male patient of Dr. Oneida Alar who presents with chief complaint: follow up for AAA.  Previous studies demonstrate an AAA, measuring 4.5 cm in January, 2015. The patient does have chronic low back pain since he had a compound fracture of lumbar spine in 2012, denies any new back pain, denies abdominal pain. The patient is not a smoker.  The patient denies claudication in legs with walking , denies non-healing wounds.  The patient denies history of stroke or TIA symptoms.  Patient states that the statins "suck the life out of me". He takes a daily 325 mg ASA and takes no anticoagulants.  He has started having cluster headaches since about 2005, states his PCP is helping him with this, takes a triptan or prednisone prn.  He reports history of DVT form right ankle to just above the right knee, after right ankle fracture about 2005.  He saw his PCP earlier this week.  Pt Diabetic: No  Pt smoker: former smoker, quit in 2013    Past Medical History  Diagnosis Date  . COPD (chronic obstructive pulmonary disease)   . Joint pain   . Bronchitis   . Productive cough   . Asthma   . Blood clot in vein   . AAA (abdominal aortic aneurysm)   . Hypertension   . DVT (deep venous thrombosis) 2005    Right  leg   Past Surgical History  Procedure Laterality Date  . Fracture surgery  2005    Right heel    Social History History   Social History  . Marital Status: Married    Spouse Name: N/A    Number of Children: N/A  . Years of Education: N/A   Occupational History  . Not on file.   Social History Main Topics  . Smoking status: Former Smoker -- 0.50 packs/day    Types: Cigarettes    Quit date: 01/12/2012  . Smokeless tobacco: Never Used     Comment: Pt smokes the [employer]  . Alcohol Use: No  . Drug Use: No  . Sexual Activity:  Not on file   Other Topics Concern  . Not on file   Social History Narrative  . No narrative on file   Family History Family History  Problem Relation Age of Onset  . Hyperlipidemia Mother   . Hypertension Mother   . Cancer Father   . Hyperlipidemia Father   . Hypertension Father   . Hyperlipidemia Sister   . Hypertension Sister   . Heart disease Brother     Heart Disease before age 45  . Heart attack Brother     x's 2  . Hyperlipidemia Brother   . Hypertension Brother     Current Outpatient Prescriptions on File Prior to Visit  Medication Sig Dispense Refill  . albuterol (PROVENTIL HFA;VENTOLIN HFA) 108 (90 BASE) MCG/ACT inhaler Inhale 2 puffs into the lungs every 6 (six) hours as needed. For shortness of breath.      Marland Kitchen aspirin 325 MG tablet Take 325 mg by mouth every morning.      Marland Kitchen atorvastatin (LIPITOR) 20 MG tablet Take 20 mg by mouth at bedtime.      . clobetasol cream (TEMOVATE) 7.03 % Apply 1 application topically 2 (two) times daily as needed. For dry skin.      Marland Kitchen doxazosin (CARDURA) 4 MG tablet  Take 4 mg by mouth at bedtime.       . finasteride (PROSCAR) 5 MG tablet Take 5 mg by mouth at bedtime.       . fluticasone (FLONASE) 50 MCG/ACT nasal spray Place 2 sprays into the nose daily.        . fluticasone (FLOVENT HFA) 44 MCG/ACT inhaler Inhale 2 puffs into the lungs at bedtime.       Marland Kitchen gemfibrozil (LOPID) 600 MG tablet Take 600 mg by mouth 2 (two) times daily.       Marland Kitchen HYDROcodone-acetaminophen (VICODIN) 5-500 MG per tablet Take 1 tablet by mouth every 6 (six) hours as needed. For pain.      Marland Kitchen HYDROmorphone (DILAUDID) 4 MG tablet Take 1 tablet (4 mg total) by mouth every 4 (four) hours as needed for pain.  6 tablet  0  . SUMAtriptan (IMITREX) 100 MG tablet Take by mouth continuous as needed.       No current facility-administered medications on file prior to visit.   No Known Allergies  ROS: See HPI for pertinent positives and negatives.  Physical  Examination  Filed Vitals:   06/18/14 0917  BP: 131/81  Pulse: 70  Resp: 16  Height: 5' 10.5" (1.791 m)  Weight: 216 lb (97.977 kg)  SpO2: 97%   Body mass index is 30.54 kg/(m^2).  General: A&O x 3, male, obese male.  Pulmonary: Sym exp, good air movt, CTAB, no rales, rhonchi, or wheezing.  Cardiac: RRR, Nl S1, S2, no Murmur detected.  Carotid Bruits  Left  Right    Negative  Negative   Aorta is is palpable.  Radial pulses are 3+ palpable and =   VASCULAR EXAM:  LE Pulses  LEFT  RIGHT   FEMORAL  palpable  palpable   POPLITEAL  not palpable  not palpable   POSTERIOR TIBIAL  palpable  palpable   DORSALIS PEDIS  ANTERIOR TIBIAL  palpable  palpable    Gastrointestinal: soft, NTND, -G/R, - HSM, - masses, - CVAT B.  Musculoskeletal: M/S 5/5 throughout, Extremities without ischemic changes. Moderate kyphosis. Right ankle bony deformity and enlargement noted medially, bilateral bunions. Moderate venous stasis skin changes noted at right medial ankle area.  Neurologic: CN 2-12 intact, Pain and light touch intact in extremities, Motor exam as listed above.     Non-Invasive Vascular Imaging  AAA Duplex (06/18/2014)  Previous size: 4.5 cm (Date: 12/11/2013)  Current size:  4.5 cm (Date: 06/18/2014)  ABDOMINAL AORTA DUPLEX EVALUATION    INDICATION: Follow up aortic aneurysm    PREVIOUS INTERVENTION(S): None    DUPLEX EXAM: Aneurysm duplex    LOCATION DIAMETER AP (cm) DIAMETER TRANSVERSE (cm) VELOCITIES (cm/sec)  Aorta Proximal 2.2 - 63  Aorta Mid 2.1 - 59  Aorta Distal 4.5 4.2 34  Right Common Iliac Artery 1.3 1.1 166  Left Common Iliac Artery 1.3 1.1 132    Previous max aortic diameter:  4.5 x 4.2 Date: 12/11/2013     ADDITIONAL FINDINGS: Soft plaque with aneurysm.    IMPRESSION: 1. 4.5 x 4.5 cm  mid to distal aortic aneurysm. 2. Technically difficult exam due to bowel gas.    Compared to the previous exam:  No significant change     Medical Decision Making  The  patient is a 73 y.o. male who presents with asymptomatic AAA with no increase in size.   Based on this patient's exam and diagnostic studies, the patient will follow up in 6 months  with  the following studies: AAA Duplex.  Consideration for repair of AAA would be made when the size is 5.5 cm, growth > 1 cm/yr, and symptomatic status.  I emphasized the importance of maximal medical management including strict control of blood pressure, blood glucose, and lipid levels, antiplatelet agents, obtaining regular exercise, and continued cessation of smoking.   The patient was given information about AAA including signs, symptoms, treatment, and how to minimize the risk of enlargement and rupture of aneurysms.    The patient was advised to call 911 should the patient experience sudden onset abdominal or back pain.   Thank you for allowing [place] to participate in this patient's care.  Clemon Chambers, RN, MSN, FNP-C [facility] [place] Office: [phone]  Clinic Physician: Bridgett Larsson  06/18/2014, 9:14 AM

## 2014-06-18 NOTE — Patient Instructions (Signed)
Abdominal Aortic Aneurysm An aneurysm is a weakened or damaged part of an artery wall that bulges from the normal force of blood pumping through the body. An abdominal aortic aneurysm is an aneurysm that occurs in the lower part of the aorta, the main artery of the body.  The major concern with an abdominal aortic aneurysm is that it can enlarge and burst (rupture) or blood can flow between the layers of the wall of the aorta through a tear (aorticdissection). Both of these conditions can cause bleeding inside the body and can be life threatening unless diagnosed and treated promptly. CAUSES  The exact cause of an abdominal aortic aneurysm is unknown. Some contributing factors are:   A hardening of the arteries caused by the buildup of fat and other substances in the lining of a blood vessel (arteriosclerosis).  Inflammation of the walls of an artery (arteritis).   Connective tissue diseases, such as Marfan syndrome.   Abdominal trauma.   An infection, such as syphilis or staphylococcus, in the wall of the aorta (infectious aortitis) caused by bacteria. RISK FACTORS  Risk factors that contribute to an abdominal aortic aneurysm may include:  Age older than 60 years.   High blood pressure (hypertension).  Male gender.  Ethnicity (white race).  Obesity.  Family history of aneurysm (first degree relatives only).  Tobacco use. PREVENTION  The following healthy lifestyle habits may help decrease your risk of abdominal aortic aneurysm:  Quitting smoking. Smoking can raise your blood pressure and cause arteriosclerosis.  Limiting or avoiding alcohol.  Keeping your blood pressure, blood sugar level, and cholesterol levels within normal limits.  Decreasing your salt intake. In somepeople, too much salt can raise blood pressure and increase your risk of abdominal aortic aneurysm.  Eating a diet low in saturated fats and cholesterol.  Increasing your fiber intake by including  whole grains, vegetables, and fruits in your diet. Eating these foods may help lower blood pressure.  Maintaining a healthy weight.  Staying physically active and exercising regularly. SYMPTOMS  The symptoms of abdominal aortic aneurysm may vary depending on the size and rate of growth of the aneurysm.Most grow slowly and do not have any symptoms. When symptoms do occur, they may include:  Pain (abdomen, side, lower back, or groin). The pain may vary in intensity. A sudden onset of severe pain may indicate that the aneurysm has ruptured.  Feeling full after eating only small amounts of food.  Nausea or vomiting or both.  Feeling a pulsating lump in the abdomen.  Feeling faint or passing out. DIAGNOSIS  Since most unruptured abdominal aortic aneurysms have no symptoms, they are often discovered during diagnostic exams for other conditions. An aneurysm may be found during the following procedures:  Ultrasonography (A one-time screening for abdominal aortic aneurysm by ultrasonography is also recommended for all men aged 65-75 years who have ever smoked).  X-ray exams.  A computed tomography (CT).  Magnetic resonance imaging (MRI).  Angiography or arteriography. TREATMENT  Treatment of an abdominal aortic aneurysm depends on the size of your aneurysm, your age, and risk factors for rupture. Medication to control blood pressure and pain may be used to manage aneurysms smaller than 6 cm. Regular monitoring for enlargement may be recommended by your caregiver if:  The aneurysm is 3-4 cm in size (an annual ultrasonography may be recommended).  The aneurysm is 4-4.5 cm in size (an ultrasonography every 6 months may be recommended).  The aneurysm is larger than 4.5 cm in   size (your caregiver may ask that you be examined by a vascular surgeon). If your aneurysm is larger than 6 cm, surgical repair may be recommended. There are two main methods for repair of an aneurysm:   Endovascular  repair (a minimally invasive surgery). This is done most often.  Open repair. This method is used if an endovascular repair is not possible. Document Released: 08/29/2005 Document Revised: 03/16/2013 Document Reviewed: 12/19/2012 ExitCare Patient Information 2015 [employer]. This information is not intended to replace advice given to you by your health care provider. Make sure you discuss any questions you have with your health care provider.  

## 2014-12-22 ENCOUNTER — Encounter: Payer: Self-pay | Admitting: Family

## 2014-12-23 ENCOUNTER — Other Ambulatory Visit: Payer: Self-pay | Admitting: Vascular Surgery

## 2014-12-23 ENCOUNTER — Ambulatory Visit (INDEPENDENT_AMBULATORY_CARE_PROVIDER_SITE_OTHER): Payer: Medicare Other | Admitting: Family

## 2014-12-23 ENCOUNTER — Ambulatory Visit (HOSPITAL_COMMUNITY)
Admission: RE | Admit: 2014-12-23 | Discharge: 2014-12-23 | Disposition: A | Discharge status: Home / Self Care | Payer: Medicare Other | Source: Ambulatory Visit | Attending: Family | Admitting: Family

## 2014-12-23 ENCOUNTER — Encounter: Payer: Self-pay | Admitting: Family

## 2014-12-23 VITALS — BP 132/87 | HR 72 | Resp 16 | Ht 70.5 in | Wt 217.0 lb

## 2014-12-23 DIAGNOSIS — I714 Abdominal aortic aneurysm, without rupture, unspecified: Secondary | ICD-10-CM

## 2014-12-23 DIAGNOSIS — Z87891 Personal history of nicotine dependence: Secondary | ICD-10-CM | POA: Insufficient documentation

## 2014-12-23 DIAGNOSIS — Z01810 Encounter for preprocedural cardiovascular examination: Secondary | ICD-10-CM

## 2014-12-23 LAB — CREATININE, SERUM: CREATININE: 1.17 mg/dL (ref 0.50–1.35)

## 2014-12-23 LAB — BUN: BUN: 21 mg/dL (ref 6–23)

## 2014-12-23 NOTE — Patient Instructions (Addendum)
Abdominal Aortic Aneurysm An aneurysm is a weakened or damaged part of an artery wall that bulges from the normal force of blood pumping through the body. An abdominal aortic aneurysm is an aneurysm that occurs in the lower part of the aorta, the main artery of the body.  The major concern with an abdominal aortic aneurysm is that it can enlarge and burst (rupture) or blood can flow between the layers of the wall of the aorta through a tear (aorticdissection). Both of these conditions can cause bleeding inside the body and can be life threatening unless diagnosed and treated promptly. CAUSES  The exact cause of an abdominal aortic aneurysm is unknown. Some contributing factors are:   A hardening of the arteries caused by the buildup of fat and other substances in the lining of a blood vessel (arteriosclerosis).  Inflammation of the walls of an artery (arteritis).   Connective tissue diseases, such as Marfan syndrome.   Abdominal trauma.   An infection, such as syphilis or staphylococcus, in the wall of the aorta (infectious aortitis) caused by bacteria. RISK FACTORS  Risk factors that contribute to an abdominal aortic aneurysm may include:  Age older than 60 years.   High blood pressure (hypertension).  Male gender.  Ethnicity (white race).  Obesity.  Family history of aneurysm (first degree relatives only).  Tobacco use. PREVENTION  The following healthy lifestyle habits may help decrease your risk of abdominal aortic aneurysm:  Quitting smoking. Smoking can raise your blood pressure and cause arteriosclerosis.  Limiting or avoiding alcohol.  Keeping your blood pressure, blood sugar level, and cholesterol levels within normal limits.  Decreasing your salt intake. In somepeople, too much salt can raise blood pressure and increase your risk of abdominal aortic aneurysm.  Eating a diet low in saturated fats and cholesterol.  Increasing your fiber intake by including  whole grains, vegetables, and fruits in your diet. Eating these foods may help lower blood pressure.  Maintaining a healthy weight.  Staying physically active and exercising regularly. SYMPTOMS  The symptoms of abdominal aortic aneurysm may vary depending on the size and rate of growth of the aneurysm.Most grow slowly and do not have any symptoms. When symptoms do occur, they may include:  Pain (abdomen, side, lower back, or groin). The pain may vary in intensity. A sudden onset of severe pain may indicate that the aneurysm has ruptured.  Feeling full after eating only small amounts of food.  Nausea or vomiting or both.  Feeling a pulsating lump in the abdomen.  Feeling faint or passing out. DIAGNOSIS  Since most unruptured abdominal aortic aneurysms have no symptoms, they are often discovered during diagnostic exams for other conditions. An aneurysm may be found during the following procedures:  Ultrasonography (A one-time screening for abdominal aortic aneurysm by ultrasonography is also recommended for all men aged 65-75 years who have ever smoked).  X-ray exams.  A computed tomography (CT).  Magnetic resonance imaging (MRI).  Angiography or arteriography. TREATMENT  Treatment of an abdominal aortic aneurysm depends on the size of your aneurysm, your age, and risk factors for rupture. Medication to control blood pressure and pain may be used to manage aneurysms smaller than 6 cm. Regular monitoring for enlargement may be recommended by your caregiver if:  The aneurysm is 3-4 cm in size (an annual ultrasonography may be recommended).  The aneurysm is 4-4.5 cm in size (an ultrasonography every 6 months may be recommended).  The aneurysm is larger than 4.5 cm in   size (your caregiver may ask that you be examined by a vascular surgeon). If your aneurysm is larger than 6 cm, surgical repair may be recommended. There are two main methods for repair of an aneurysm:   Endovascular  repair (a minimally invasive surgery). This is done most often.  Open repair. This method is used if an endovascular repair is not possible. Document Released: 08/29/2005 Document Revised: 03/16/2013 Document Reviewed: 12/19/2012 ExitCare Patient Information 2015 [employer]. This information is not intended to replace advice given to you by your health care provider. Make sure you discuss any questions you have with your health care provider.  

## 2014-12-23 NOTE — Progress Notes (Signed)
[facility] [place]  Established Abdominal Aortic Aneurysm  History of Present Illness  Reginald [name] is a 74 y.o. (Feb 17, 1941) male patient of Dr. Oneida Alar who presents with chief complaint: follow up for AAA.  Previous studies demonstrate an AAA, measuring 4.5 cm in January, 2015. The patient does have chronic low back pain since he had a compound fracture of lumbar spine in 2012, denies any new back pain, denies abdominal pain. The patient is not a smoker.  The patient denies claudication in legs with walking , denies non-healing wounds.  The patient denies history of stroke or TIA symptoms.  Patient states that the statins "suck the life out of me". He takes a daily 325 mg ASA and takes no anticoagulants.  He has cluster headaches since about 2005, states his PCP is helping him with this, takes a triptan or prednisone prn.  He reports history of DVT form right ankle to just above the right knee, after right ankle fracture about 2005.  He recently had ESI's, had 2 weeks relief, then same chronic low back and right hip pain returned, had relief from right shoulder pain after a corticosteroid injection. He saw his PCP yesterday.  Pt Diabetic: No  Pt smoker: former smoker, quit in 2013   Past Medical History  Diagnosis Date  . COPD (chronic obstructive pulmonary disease)   . Joint pain   . Bronchitis   . Productive cough   . Asthma   . Blood clot in vein   . AAA (abdominal aortic aneurysm)   . Hypertension   . DVT (deep venous thrombosis) 2005    Right  leg   Past Surgical History  Procedure Laterality Date  . Fracture surgery  2005    Right heel - Fx and NO surgery   Social History History   Social History  . Marital Status: Married    Spouse Name: N/A    Number of Children: N/A  . Years of Education: N/A   Occupational History  . Not on file.   Social History Main Topics  . Smoking status: Former Smoker -- 0.50 packs/day    Types:  Cigarettes    Quit date: 01/12/2012  . Smokeless tobacco: Never Used     Comment: Pt smokes the Electronic Cig[employer]tts  . Alcohol Use: No  . Drug Use: No  . Sexual Activity: Not on file   Other Topics Concern  . Not on file   Social History Narrative   Family History Family History  Problem Relation Age of Onset  . Hyperlipidemia Mother   . Hypertension Mother   . Cancer Father     Bladder  . Hyperlipidemia Father   . Hypertension Father   . Hyperlipidemia Sister   . Hypertension Sister   . Heart disease Brother     Heart Disease before age 17  . Heart attack Brother     x's 2  . Hyperlipidemia Brother   . Hypertension Brother     Current Outpatient Prescriptions on File Prior to Visit  Medication Sig Dispense Refill  . albuterol (PROVENTIL HFA;VENTOLIN HFA) 108 (90 BASE) MCG/ACT inhaler Inhale 2 puffs into the lungs every 6 (six) hours as needed. For shortness of breath.    Marland Kitchen aspirin 325 MG tablet Take 325 mg by mouth every morning.    Marland Kitchen atorvastatin (LIPITOR) 20 MG tablet Take 20 mg by mouth at bedtime.    . clobetasol cream (TEMOVATE) 7.59 % Apply 1 application topically 2 (  two) times daily as needed. For dry skin.    Marland Kitchen doxazosin (CARDURA) 4 MG tablet Take 4 mg by mouth at bedtime.     . finasteride (PROSCAR) 5 MG tablet Take 5 mg by mouth at bedtime.     . fluticasone (FLONASE) 50 MCG/ACT nasal spray Place 2 sprays into the nose daily.      Marland Kitchen gemfibrozil (LOPID) 600 MG tablet Take 600 mg by mouth 2 (two) times daily.     Marland Kitchen HYDROcodone-acetaminophen (VICODIN) 5-500 MG per tablet Take by mouth every 6 (six) hours as needed for pain (5-325 mg  q 6 hrs). For pain.    . SUMAtriptan (IMITREX) 100 MG tablet Take by mouth continuous as needed.    . fluticasone (FLOVENT HFA) 44 MCG/ACT inhaler Inhale 2 puffs into the lungs at bedtime.     Marland Kitchen HYDROmorphone (DILAUDID) 4 MG tablet Take 1 tablet (4 mg total) by mouth every 4 (four) hours as needed for pain. (Patient not taking:  Reported on 12/23/2014) 6 tablet 0   No current facility-administered medications on file prior to visit.   No Known Allergies  ROS: See HPI for pertinent positives and negatives.  Physical Examination  Filed Vitals:   12/23/14 0910  BP: 132/87  Pulse: 72  Resp: 16  Height: 5' 10.5" (1.791 m)  Weight: 217 lb (98.431 kg)  SpO2: 97%   Body mass index is 30.69 kg/(m^2).  General: A&O x 3, male, obese male.  Pulmonary: Sym exp, good air movt, CTAB, no rales, rhonchi, or wheezing.  Cardiac: RRR, Nl S1, S2, no Murmur detected.   Carotid Bruits  Left  Right    Negative  Negative   Aorta is not palpable.  Radial pulses are 2+ palpable and =   VASCULAR EXAM:  LE Pulses  LEFT  RIGHT   FEMORAL  2+palpable  2+palpable   POPLITEAL  not palpable  not palpable   POSTERIOR TIBIAL  Not palpable  Not palpable   DORSALIS PEDIS  ANTERIOR TIBIAL  2+palpable  2+palpable    Gastrointestinal: soft, NTND, -G/R, - HSM, - palpable masses, - CVAT B.  Musculoskeletal: M/S 5/5 throughout, Extremities without ischemic changes. Moderate kyphosis. Right ankle bony deformity and enlargement noted medially, bilateral bunions. Moderate venous stasis skin changes noted at right medial ankle area. 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  Neurologic: CN 2-12 intact, Pain and light touch intact in extremities, Motor exam as listed above.     Non-Invasive Vascular Imaging  AAA Duplex (12/23/2014) ABDOMINAL AORTA DUPLEX EVALUATION    INDICATION: Follow up aortic aneurysm    PREVIOUS INTERVENTION(S): None    DUPLEX EXAM:     LOCATION DIAMETER AP (cm) DIAMETER TRANSVERSE (cm) VELOCITIES (cm/sec)  Aorta Proximal 2.2 - 54  Aorta Mid 2.1 1.9 58  Aorta Distal 5.2 4.9 30  Right Common Iliac Artery 1.6 - 43  Left Common Iliac Artery 1.3 1.3 119    Previous max aortic diameter:  4.5 x 4.2 Date: 06/18/2014  ADDITIONAL FINDINGS:   IMPRESSION:  1.5.2 x 4.9 cm distal abdominal aortic aneurysm. 2.Technically difficult exam due to bowel gas.    Compared to the previous exam:  Increase in size     Medical Decision Making  The patient is a 74 y.o. male who presents with asymptomatic AAA with an increase in size from 4.5 to 5.2 cm maximal diameter in 6 months, maximum diameter was 4.5 cm a year ago also.  However this was a technically difficult Duplex exam due to bowel gas.  - Onchomycosis and long curling toenails: pt advised to ask his PCP for a referral to a podiatrist to cut his toenails on a regular basis.  Based on this patient's exam and diagnostic studies, and after discussing with Dr. Oneida Alar, the patient will follow up in 1-3 weeks with the following studies: CTA abdomen/pelvis and follow up with Dr. Oneida Alar.   Consideration for repair of AAA would be made when the size is 5.5 cm, growth > 1 cm/yr, and symptomatic status.  I emphasized the importance of maximal medical management including strict control of blood pressure, blood glucose, and lipid levels, antiplatelet agents, obtaining regular exercise, and continued cessation of smoking.   The patient was given information about AAA including signs, symptoms, treatment, and how to minimize the risk of enlargement and rupture of aneurysms.    The patient was advised to call [phone] should the patient experience sudden onset abdominal or back pain.   Thank you for allowing [place] to participate in this patient's care.  Clemon Chambers, RN, MSN, FNP-C [facility] [place] Office: [phone]  Clinic Physician: Oneida Alar  12/23/2014, 9:21 AM

## 2014-12-23 NOTE — Addendum Note (Signed)
Addended by: Mena Goes on: 12/23/2014 12:38 PM   Modules accepted: Orders

## 2015-01-05 ENCOUNTER — Ambulatory Visit
Admission: RE | Admit: 2015-01-05 | Discharge: 2015-01-05 | Disposition: A | Discharge status: Home / Self Care | Payer: Medicare Other | Source: Ambulatory Visit | Attending: Family | Admitting: Family

## 2015-01-05 ENCOUNTER — Encounter: Payer: Self-pay | Admitting: Vascular Surgery

## 2015-01-05 DIAGNOSIS — I714 Abdominal aortic aneurysm, without rupture, unspecified: Secondary | ICD-10-CM

## 2015-01-05 DIAGNOSIS — Z01810 Encounter for preprocedural cardiovascular examination: Secondary | ICD-10-CM

## 2015-01-05 MED ORDER — IOHEXOL 350 MG/ML SOLN
75.0000 mL | Freq: Once | INTRAVENOUS | Status: AC | PRN
  Administered 2015-01-05: 75 mL via INTRAVENOUS

## 2015-01-06 ENCOUNTER — Telehealth: Payer: Self-pay | Admitting: Vascular Surgery

## 2015-01-06 ENCOUNTER — Ambulatory Visit (INDEPENDENT_AMBULATORY_CARE_PROVIDER_SITE_OTHER): Payer: Medicare Other | Admitting: Vascular Surgery

## 2015-01-06 ENCOUNTER — Encounter: Payer: Self-pay | Admitting: Vascular Surgery

## 2015-01-06 VITALS — BP 139/78 | HR 70 | Temp 97.4°F | Resp 16 | Ht 70.0 in | Wt 216.2 lb

## 2015-01-06 DIAGNOSIS — Z01810 Encounter for preprocedural cardiovascular examination: Secondary | ICD-10-CM

## 2015-01-06 DIAGNOSIS — I714 Abdominal aortic aneurysm, without rupture, unspecified: Secondary | ICD-10-CM

## 2015-01-06 NOTE — Telephone Encounter (Signed)
Spoke with patient - Cardiology preop appointment [place] 01/10/15 2:15 pm Dr. Stanford Breed [address] [facility] [phone] Patient verbalized understanding - kf

## 2015-01-06 NOTE — Progress Notes (Signed)
[facility] [place] HISTORY AND PHYSICAL   History of Present Illness:  Patient is a 74 y.o. year old male who presents for evaluation of abdominal aortic aneurysm. The patient has been followed with serial ultrasounds over the last several years. When he was first evaluated in 2011 his aneurysm diameter was 3.5 cm. Recent ultrasound done in our office suggested the aneurysm had grown to more than 5 cm. He presents today for further follow-up after CT angiogram for for evaluation. He has chronic back pain. He has noticed no acute changes in this. He denies abdominal pain. Other medical problems include COPD, hypertension, prior right leg DVT. All of these are currently stable. He denies chest pain. He denies shortness of breath. He states he can climb 2 flights of stairs without becoming short of breath. He is on a statin and aspirin.  Past Medical History  Diagnosis Date  . COPD (chronic obstructive pulmonary disease)   . Joint pain   . Bronchitis   . Productive cough   . Asthma   . Blood clot in vein   . AAA (abdominal aortic aneurysm)   . Hypertension   . DVT (deep venous thrombosis) 2005    Right  leg    Past Surgical History  Procedure Laterality Date  . Fracture surgery  2005    Right heel - Fx and NO surgery    Social History History  Substance Use Topics  . Smoking status: Former Smoker -- 0.50 packs/day    Types: Cigarettes    Quit date: 01/12/2012  . Smokeless tobacco: Never Used     Comment: Pt smokes the Electronic [employer]ig[employer]s  . Alcohol Use: No    Family History Family History  Problem Relation Age of Onset  . Hyperlipidemia Mother   . Hypertension Mother   . Cancer Father     Bladder  . Hyperlipidemia Father   . Hypertension Father   . Hyperlipidemia Sister   . Hypertension Sister   . Heart disease Brother     Heart Disease before age 69  . Heart attack Brother     x's 2  . Hyperlipidemia Brother   . Hypertension Brother      Allergies  No Known Allergies   Current Outpatient Prescriptions  Medication Sig Dispense Refill  . albuterol (PROVENTIL HFA;VENTOLIN HFA) 108 (90 BASE) MCG/ACT inhaler Inhale 2 puffs into the lungs every 6 (six) hours as needed. For shortness of breath.    Marland Kitchen aspirin 325 MG tablet Take 325 mg by mouth every morning.    Marland Kitchen atorvastatin (LIPITOR) 20 MG tablet Take 20 mg by mouth at bedtime.    . clobetasol cream (TEMOVATE) 9.37 % Apply 1 application topically 2 (two) times daily as needed. For dry skin.    Marland Kitchen doxazosin (CARDURA) 4 MG tablet Take 4 mg by mouth at bedtime.     . finasteride (PROSCAR) 5 MG tablet Take 5 mg by mouth at bedtime.     . fluticasone (FLONASE) 50 MCG/ACT nasal spray Place 2 sprays into the nose daily.      . fluticasone (FLOVENT HFA) 44 MCG/ACT inhaler Inhale 2 puffs into the lungs at bedtime.     Marland Kitchen gemfibrozil (LOPID) 600 MG tablet Take 600 mg by mouth 2 (two) times daily.     Marland Kitchen HYDROcodone-acetaminophen (VICODIN) 5-500 MG per tablet Take by mouth every 6 (six) hours as needed for pain (5-325 mg  q 6 hrs). For pain.    Marland Kitchen  SUMAtriptan (IMITREX) 100 MG tablet Take by mouth continuous as needed.     No current facility-administered medications for this visit.    ROS:   General:  No weight loss, Fever, chills  HEENT: No recent headaches, no nasal bleeding, no visual changes, no sore throat  Neurologic: No dizziness, blackouts, seizures. No recent symptoms of stroke or mini- stroke. No recent episodes of slurred speech, or temporary blindness.  Cardiac: No recent episodes of chest pain/pressure, no shortness of breath at rest.  No shortness of breath with exertion.  Denies history of atrial fibrillation or irregular heartbeat  Vascular: No history of rest pain in feet.  No history of claudication.  No history of non-healing ulcer, No history of DVT   Pulmonary: No home oxygen, no productive cough, no hemoptysis,  No asthma or wheezing  Musculoskeletal:  [ ]   Arthritis, [ ]  Low back pain,  [ ]  Joint pain  Hematologic:No history of hypercoagulable state.  No history of easy bleeding.  No history of anemia  Gastrointestinal: No hematochezia or melena,  No gastroesophageal reflux, no trouble swallowing  Urinary: [ ]  chronic Kidney disease, [ ]  on HD - [ ]  MWF or [ ]  TTHS, [ ]  Burning with urination, [ ]  Frequent urination, [ ]  Difficulty urinating;   Skin: No rashes  Psychological: No history of anxiety,  No history of depression   Physical Examination  Filed Vitals:   01/06/15 0838  BP: 139/78  Pulse: 70  Temp: 97.4 F (36.3 C)  TempSrc: Oral  Resp: 16  Height: 5\' 10"  (1.778 m)  Weight: 216 lb 3.2 oz (98.068 kg)  SpO2: 94%    Body mass index is 31.02 kg/(m^2).  General:  Alert and oriented, no acute distress HEENT: Normal Neck: No bruit or JVD Pulmonary: Clear to auscultation bilaterally Cardiac: Regular Rate and Rhythm without murmur Abdomen: Soft, non-tender, non-distended, no mass Skin: No rash Extremity Pulses:  2+ radial, brachial, femoral, dorsalis pedis pulses bilaterally Musculoskeletal: No deformity or edema  Neurologic: Upper and lower extremity motor 5/5 and symmetric  DATA:  CT Angio the abdomen and pelvis images are reviewed today. This shows the aneurysm diameter is now 6.3 cm. There is no obvious iliac involvement. The aneurysm is infrarenal. There is no evidence of rupture. He does have a reasonable neck and looks amenable to stent graft repair.   ASSESSMENT:  I discussed with the patient today that the aneurysm is at a size that is increased risk of rupture. I believe we should consider elective repair at this point. I discussed with the patient today the possibility of open aneurysm repair versus stent graft repair. I recommended primarily stent graft repair. I did discuss with him the need for further follow-up after stent graft repair versus the option of open repair which is a larger operation up front but  less follow-up in the long-term. He thinks at this point he would opt for a stent graft repair. We will have him evaluated from a cardiac risk standpoint and then plan to tentatively repair his aneurysm with a stent graft in late February.   PLAN: See above  Ruta Hinds, MD [facility] [place] Office: [phone] Pager: [phone]

## 2015-01-08 NOTE — Progress Notes (Signed)
HPI: 74 yo male for preop evaluation prior to repair of AAA. Patient denies chest pain, palpitations, syncope or pedal edema. Mild dyspnea on exertion.  Current Outpatient Prescriptions  Medication Sig Dispense Refill  . albuterol (PROVENTIL HFA;VENTOLIN HFA) 108 (90 BASE) MCG/ACT inhaler Inhale 2 puffs into the lungs every 6 (six) hours as needed. For shortness of breath.    Marland Kitchen aspirin 325 MG tablet Take 325 mg by mouth every morning.    Marland Kitchen atorvastatin (LIPITOR) 20 MG tablet Take 20 mg by mouth at bedtime.    . clobetasol cream (TEMOVATE) 4.13 % Apply 1 application topically 2 (two) times daily as needed. For dry skin.    Marland Kitchen doxazosin (CARDURA) 4 MG tablet Take 4 mg by mouth at bedtime.     . finasteride (PROSCAR) 5 MG tablet Take 5 mg by mouth at bedtime.     . fluticasone (FLONASE) 50 MCG/ACT nasal spray Place 2 sprays into the nose daily.      . fluticasone (FLOVENT HFA) 44 MCG/ACT inhaler Inhale 2 puffs into the lungs at bedtime.     Marland Kitchen gemfibrozil (LOPID) 600 MG tablet Take 600 mg by mouth 2 (two) times daily.     Marland Kitchen HYDROcodone-acetaminophen (VICODIN) 5-500 MG per tablet Take by mouth every 6 (six) hours as needed for pain (5-325 mg  q 6 hrs). For pain.    . SUMAtriptan (IMITREX) 100 MG tablet Take by mouth continuous as needed.    . triamcinolone ointment (KENALOG) 0.1 % as needed.     No current facility-administered medications for this visit.    No Known Allergies   Past Medical History  Diagnosis Date  . COPD (chronic obstructive pulmonary disease)   . Joint pain   . Asthma   . AAA (abdominal aortic aneurysm)   . Hyperlipidemia   . DVT (deep venous thrombosis) 2005    Right  leg    Past Surgical History  Procedure Laterality Date  . Fracture surgery  2005    Right heel - Fx and NO surgery    History   Social History  . Marital Status: Married    Spouse Name: N/A    Number of Children: 2  . Years of Education: N/A   Occupational History  . Not on file.     Social History Main Topics  . Smoking status: Former Smoker -- 0.50 packs/day    Types: Cigarettes    Quit date: 01/12/2012  . Smokeless tobacco: Never Used     Comment: Pt smokes the [employer]s  . Alcohol Use: No  . Drug Use: No  . Sexual Activity: Not on file   Other Topics Concern  . Not on file   Social History Narrative    Family History  Problem Relation Age of Onset  . Hyperlipidemia Mother   . Hypertension Mother   . Cancer Father     Bladder  . Hyperlipidemia Father   . Hypertension Father   . Hyperlipidemia Sister   . Hypertension Sister   . Heart disease Brother     Heart Disease before age 87  . Heart attack Brother     x's 2  . Hyperlipidemia Brother   . Hypertension Brother     ROS: hip arthralgias but no fevers or chills, productive cough, hemoptysis, dysphasia, odynophagia, melena, hematochezia, dysuria, hematuria, rash, seizure activity, orthopnea, PND, pedal edema, claudication. Remaining systems are negative.  Physical Exam:   Blood pressure 154/98, pulse 74, height  5\' 10"  (1.778 m), weight 216 lb 4.8 oz (98.113 kg).  General:  Well developed/well nourished in NAD Skin warm/dry Patient not depressed No peripheral clubbing Back-normal HEENT-normal/normal eyelids Neck supple/normal carotid upstroke bilaterally; no bruits; no JVD; no thyromegaly chest - CTA/ normal expansion CV - RRR/normal S1 and S2; no murmurs, rubs or gallops;  PMI nondisplaced Abdomen -NT/ND, no HSM, no mass, + bowel sounds, no bruit 2+ femoral pulses, no bruits Ext-no edema, chords, 2+ DP Neuro-grossly nonfocal  ECG sinus rhythm at a rate of 74. No ST changes.

## 2015-01-10 ENCOUNTER — Encounter: Payer: Self-pay | Admitting: *Deleted

## 2015-01-10 ENCOUNTER — Ambulatory Visit (INDEPENDENT_AMBULATORY_CARE_PROVIDER_SITE_OTHER): Payer: Medicare Other | Admitting: Cardiology

## 2015-01-10 ENCOUNTER — Encounter: Payer: Self-pay | Admitting: Cardiology

## 2015-01-10 VITALS — BP 154/98 | HR 74 | Ht 70.0 in | Wt 216.3 lb

## 2015-01-10 DIAGNOSIS — Z01810 Encounter for preprocedural cardiovascular examination: Secondary | ICD-10-CM

## 2015-01-10 DIAGNOSIS — I714 Abdominal aortic aneurysm, without rupture, unspecified: Secondary | ICD-10-CM

## 2015-01-10 DIAGNOSIS — E785 Hyperlipidemia, unspecified: Secondary | ICD-10-CM

## 2015-01-10 DIAGNOSIS — Z01818 Encounter for other preprocedural examination: Secondary | ICD-10-CM

## 2015-01-10 NOTE — Assessment & Plan Note (Signed)
Patient is scheduled for abdominal aortic aneurysm repair. Multiple risk factors including family history, previous tobacco use and hyperlipidemia. We'll plan [place] nuclear study for risk stratification preoperatively.

## 2015-01-10 NOTE — Assessment & Plan Note (Signed)
Management per vascular surgery.

## 2015-01-10 NOTE — Assessment & Plan Note (Signed)
Continue statin. 

## 2015-01-10 NOTE — Patient Instructions (Signed)
Your physician recommends that you schedule a follow-up appointment in:  AS NEEDED PENDING TEST RESULTS  Your physician has requested that you have a lexiscan myoview. For further information please visit [email or web address]. Please follow instruction sheet, as given.

## 2015-01-11 ENCOUNTER — Telehealth (HOSPITAL_COMMUNITY): Payer: Self-pay

## 2015-01-11 NOTE — Telephone Encounter (Signed)
Encounter complete. 

## 2015-01-12 ENCOUNTER — Ambulatory Visit (HOSPITAL_COMMUNITY)
Admission: RE | Admit: 2015-01-12 | Discharge: 2015-01-12 | Disposition: A | Discharge status: Home / Self Care | Payer: Medicare Other | Source: Ambulatory Visit | Attending: Cardiology | Admitting: Cardiology

## 2015-01-12 DIAGNOSIS — I10 Essential (primary) hypertension: Secondary | ICD-10-CM | POA: Diagnosis not present

## 2015-01-12 DIAGNOSIS — Z01818 Encounter for other preprocedural examination: Secondary | ICD-10-CM | POA: Insufficient documentation

## 2015-01-12 DIAGNOSIS — Z79899 Other long term (current) drug therapy: Secondary | ICD-10-CM | POA: Diagnosis not present

## 2015-01-12 DIAGNOSIS — E663 Overweight: Secondary | ICD-10-CM | POA: Diagnosis not present

## 2015-01-12 DIAGNOSIS — E785 Hyperlipidemia, unspecified: Secondary | ICD-10-CM | POA: Diagnosis not present

## 2015-01-12 DIAGNOSIS — R0609 Other forms of dyspnea: Secondary | ICD-10-CM | POA: Insufficient documentation

## 2015-01-12 DIAGNOSIS — Z87891 Personal history of nicotine dependence: Secondary | ICD-10-CM | POA: Diagnosis not present

## 2015-01-12 DIAGNOSIS — Z8249 Family history of ischemic heart disease and other diseases of the circulatory system: Secondary | ICD-10-CM | POA: Insufficient documentation

## 2015-01-12 MED ORDER — TECHNETIUM TC 99M SESTAMIBI GENERIC - CARDIOLITE
31.3000 | Freq: Once | INTRAVENOUS | Status: AC | PRN
  Administered 2015-01-12: 31.3 via INTRAVENOUS

## 2015-01-12 MED ORDER — REGADENOSON 0.4 MG/5ML IV SOLN
0.4000 mg | Freq: Once | INTRAVENOUS | Status: AC
Start: 2015-01-12 — End: 2015-01-12
  Administered 2015-01-12: 0.4 mg via INTRAVENOUS

## 2015-01-12 MED ORDER — TECHNETIUM TC 99M SESTAMIBI GENERIC - CARDIOLITE
10.4000 | Freq: Once | INTRAVENOUS | Status: AC | PRN
  Administered 2015-01-12: 10 via INTRAVENOUS

## 2015-01-12 NOTE — Procedures (Addendum)
 [facility] [address] [place] [address] [phone]  Cardiology Nuclear Med Study  Reginald [name] is a 74 y.o. male     MRN : [id number]     DOB: 02/16/1941  Procedure Date: 01/12/2015  Nuclear Med Background Indication for Stress Test:  Surgical Clearance History:  Asthma, COPD and No prior cardiac history reported;No prior NUC MPI for comparison;AAA;DVT Cardiac Risk Factors: Family History - CAD, History of Smoking, Hypertension, Lipids and Overweight  Symptoms:  DOE   Nuclear Pre-Procedure Caffeine/Decaff Intake:  7:00pm NPO After: 5:00am   IV Site: R Forearm  IV 0.9% NS with Angio Cath:  22g  Chest Size (in):  46" IV Started by: Rolene Course, RN  Height: 5\' 10"  (1.778 m)  Cup Size: n/a  BMI:  Body mass index is 30.99 kg/(m^2). Weight:  216 lb (97.977 kg)   Tech Comments:  n/a    Nuclear Med Study 1 or 2 day study: 1 day  Stress Test Type:  [place] Provider:  Kirk Ruths, MD   Resting Radionuclide: Technetium 69m Sestamibi  Resting Radionuclide Dose: 10.4 mCi   Stress Radionuclide:  Technetium 57m Sestamibi  Stress Radionuclide Dose: 31.3 mCi           Stress Protocol Rest HR: 65 Stress HR: 75  Rest BP: 140/92 Stress BP: 134/89  Exercise Time (min): n/a METS: n/a   Predicted Max HR: 146 bpm % Max HR: 58.22 bpm Rate Pressure Product: 11900  Dose of Adenosine (mg):  n/a Dose of Lexiscan: 0.4 mg  Dose of Atropine (mg): n/a Dose of Dobutamine: n/a mcg/kg/min (at max HR)  Stress Test Technologist: Leane Para, CCT Nuclear Technologist: Imagene Riches, CNMT   Rest Procedure:  Myocardial perfusion imaging was performed at rest 45 minutes following the intravenous administration of Technetium 41m Sestamibi. Stress Procedure:  The patient received IV Lexiscan 0.4 mg over 15-seconds.  Technetium 45m Sestamibi injected IV at 30-seconds.  There were no significant changes with Lexiscan.   Quantitative spect images were obtained after a 45 minute delay.  Transient Ischemic Dilatation (Normal <1.22):  1.03  QGS EDV:  78 ml QGS ESV:  26 ml LV Ejection Fraction: 66%  PHYSICIAN INTERPRETATION  Rest ECG: NSR - Normal EKG and NSR with non-specific ST-T wave changes  Stress ECG: No significant change from baseline ECG  QPS Raw Data Images:  Acquisition technically good; normal left ventricular size. Notable splanchnic tracer uptake - does not impede interpretation. Stress Images:  Normal homogeneous uptake in all areas of the myocardium. Rest Images:  Normal homogeneous uptake in all areas of the myocardium. Subtraction (SDS):  There is no evidence of scar or ischemia.  Impression Exercise Capacity:  Lexiscan with no exercise. BP Response:  Normal blood pressure response. Clinical Symptoms:  No significant symptoms noted. ECG Impression:  No significant ECG changes with Lexiscan. Comparison with Prior Nuclear Study: No images to compare  Overall Impression:  Normal stress nuclear study.  LV Wall Motion:  NL LV Function; NL Wall Motion   Leonie Man, MD  01/12/2015 7:01 PM

## 2015-01-14 ENCOUNTER — Other Ambulatory Visit: Payer: Self-pay

## 2015-01-18 ENCOUNTER — Encounter (HOSPITAL_COMMUNITY): Payer: Self-pay | Admitting: Pharmacy Technician

## 2015-01-20 ENCOUNTER — Encounter (HOSPITAL_COMMUNITY)
Admission: RE | Admit: 2015-01-20 | Discharge: 2015-01-20 | Disposition: A | Discharge status: Home / Self Care | Payer: Medicare Other | Source: Ambulatory Visit | Attending: Vascular Surgery | Admitting: Vascular Surgery

## 2015-01-20 ENCOUNTER — Encounter (HOSPITAL_COMMUNITY): Payer: Self-pay

## 2015-01-20 ENCOUNTER — Other Ambulatory Visit (HOSPITAL_COMMUNITY): Payer: Self-pay | Admitting: *Deleted

## 2015-01-20 DIAGNOSIS — Z01818 Encounter for other preprocedural examination: Secondary | ICD-10-CM | POA: Diagnosis present

## 2015-01-20 DIAGNOSIS — I714 Abdominal aortic aneurysm, without rupture: Secondary | ICD-10-CM | POA: Diagnosis not present

## 2015-01-20 HISTORY — DX: Reserved for inherently not codable concepts without codable children: IMO0001

## 2015-01-20 HISTORY — DX: Unspecified osteoarthritis, unspecified site: M19.90

## 2015-01-20 HISTORY — DX: Headache, unspecified: R51.9

## 2015-01-20 HISTORY — DX: Headache: R51

## 2015-01-20 LAB — BLOOD GAS, ARTERIAL
ACID-BASE DEFICIT: 4.8 mmol/L — AB (ref 0.0–2.0)
Bicarbonate: 19.3 mEq/L — ABNORMAL LOW (ref 20.0–24.0)
Drawn by: 42180
FIO2: 0.21 %
O2 Saturation: 95.8 %
PCO2 ART: 32.4 mmHg — AB (ref 35.0–45.0)
PH ART: 7.392 (ref 7.350–7.450)
PO2 ART: 81.1 mmHg (ref 80.0–100.0)
Patient temperature: 98.6
TCO2: 20.3 mmol/L (ref 0–100)

## 2015-01-20 LAB — TYPE AND SCREEN
ABO/RH(D): A POS
Antibody Screen: NEGATIVE

## 2015-01-20 LAB — COMPREHENSIVE METABOLIC PANEL
ALBUMIN: 3.9 g/dL (ref 3.5–5.2)
ALK PHOS: 67 U/L (ref 39–117)
ALT: 14 U/L (ref 0–53)
AST: 19 U/L (ref 0–37)
Anion gap: 8 (ref 5–15)
BILIRUBIN TOTAL: 1.3 mg/dL — AB (ref 0.3–1.2)
BUN: 18 mg/dL (ref 6–23)
CHLORIDE: 111 mmol/L (ref 96–112)
CO2: 17 mmol/L — ABNORMAL LOW (ref 19–32)
Calcium: 9.5 mg/dL (ref 8.4–10.5)
Creatinine, Ser: 1.05 mg/dL (ref 0.50–1.35)
GFR calc Af Amer: 79 mL/min — ABNORMAL LOW (ref >90)
GFR calc non Af Amer: 68 mL/min — ABNORMAL LOW (ref >90)
Glucose, Bld: 101 mg/dL — ABNORMAL HIGH (ref 70–99)
Potassium: 4.8 mmol/L (ref 3.5–5.1)
Sodium: 136 mmol/L (ref 135–145)
Total Protein: 7.7 g/dL (ref 6.0–8.3)

## 2015-01-20 LAB — URINALYSIS, ROUTINE W REFLEX MICROSCOPIC
Bilirubin Urine: NEGATIVE
Glucose, UA: NEGATIVE mg/dL
KETONES UR: NEGATIVE mg/dL
Leukocytes, UA: NEGATIVE
Nitrite: NEGATIVE
PROTEIN: NEGATIVE mg/dL
Specific Gravity, Urine: 1.018 (ref 1.005–1.030)
UROBILINOGEN UA: 0.2 mg/dL (ref 0.0–1.0)
pH: 5.5 (ref 5.0–8.0)

## 2015-01-20 LAB — CBC
HEMATOCRIT: 42.1 % (ref 39.0–52.0)
HEMOGLOBIN: 14.8 g/dL (ref 13.0–17.0)
MCH: 33 pg (ref 26.0–34.0)
MCHC: 35.2 g/dL (ref 30.0–36.0)
MCV: 94 fL (ref 78.0–100.0)
Platelets: 203 10*3/uL (ref 150–400)
RBC: 4.48 MIL/uL (ref 4.22–5.81)
RDW: 13.3 % (ref 11.5–15.5)
WBC: 6.2 10*3/uL (ref 4.0–10.5)

## 2015-01-20 LAB — URINE MICROSCOPIC-ADD ON

## 2015-01-20 LAB — APTT: APTT: 31 s (ref 24–37)

## 2015-01-20 LAB — PROTIME-INR
INR: 1.08 (ref 0.00–1.49)
Prothrombin Time: 14.1 seconds (ref 11.6–15.2)

## 2015-01-20 LAB — SURGICAL PCR SCREEN
MRSA, PCR: NEGATIVE
STAPHYLOCOCCUS AUREUS: POSITIVE — AB

## 2015-01-20 LAB — ABO/RH: ABO/RH(D): A POS

## 2015-01-20 NOTE — Progress Notes (Addendum)
PCP: Dr. Redmond Pulling  Cardiologist: Dr. Stanford Breed  Mupirocin ointment called in to [facility] on [address]. In [place]

## 2015-01-20 NOTE — Pre-Procedure Instructions (Signed)
Reginald [name]  01/20/2015   Your procedure is scheduled on:  Wednesday, January 26, 2015 at 12:15 PM.   Report to [facility] Entrance "A" Admitting Office at 10:15 AM.   Call this number if you have problems the morning of surgery: [phone]               Any questions prior to day of surgery, please call [phone] between 8 & 4 PM.   Remember:   Do not eat food or drink liquids after midnight Tuesday, 01/25/15.   Take these medicines the morning of surgery with A SIP OF WATER: Aspirin, Flonase nasal spray, Flovent inhaler, Albuterol inhaler - if needed   Do not wear jewelry.  Do not wear lotions, powders, or cologne. You may wear deodorant.  Men may shave face and neck.  Do not bring valuables to the hospital.  [facility] is not responsible                  for any belongings or valuables.               Contacts, dentures or bridgework may not be worn into surgery.  Leave suitcase in the car. After surgery it may be brought to your room.  For patients admitted to the hospital, discharge time is determined by your                treatment team.             Special Instructions: See "Preparing for Surgery" instruction sheet   Please read over the following fact sheets that you were given: Pain Booklet, Coughing and Deep Breathing, Blood Transfusion Information, MRSA Information and Surgical Site Infection Prevention

## 2015-01-21 NOTE — Progress Notes (Signed)
Anesthesia Chart Review:  Pt is 74 year old male scheduled for abdominal aortic endovascular stent graft on 01/26/2015 with Dr. Oneida Alar.   PMH includes: AAA, DVT, asthma, COPD, hyperlipidemia. Former smoker. BMI 31  Preoperative labs reviewed.    EKG 01/10/2015: sinus rhythm. No ST changes. Interpreted by Dr. Stanford Breed.   Nuclear stress test 01/12/2015: -Normal stress nuclear study. LV Wall Motion: NL LV Function; NL Wall Motion  Pt saw Dr. Stanford Breed for preoperative cardiac clearance. See Epic note dated 01/10/2015.   If no changes, I anticipate pt can proceed with surgery as scheduled.   Willeen Cass, FNP-BC [facility]/Anesthesiology Phone: [phone] 01/21/2015 1:25 PM

## 2015-01-25 MED ORDER — CEFUROXIME SODIUM 1.5 G IJ SOLR
1.5000 g | INTRAMUSCULAR | Status: AC
  Administered 2015-01-26: 1.5 g via INTRAVENOUS
  Filled 2015-01-25: qty 1.5

## 2015-01-25 MED ORDER — SODIUM CHLORIDE 0.9 % IV SOLN: INTRAVENOUS | Status: DC

## 2015-01-25 MED ORDER — CHLORHEXIDINE GLUCONATE 4 % EX LIQD
60.0000 mL | Freq: Once | CUTANEOUS | Status: DC
  Filled 2015-01-25: qty 60

## 2015-01-26 ENCOUNTER — Encounter (HOSPITAL_COMMUNITY)
Admission: RE | Disposition: A | Discharge status: Home / Self Care | Payer: Self-pay | Source: Ambulatory Visit | Attending: Vascular Surgery

## 2015-01-26 ENCOUNTER — Inpatient Hospital Stay (HOSPITAL_COMMUNITY): Payer: Medicare Other | Admitting: Certified Registered"

## 2015-01-26 ENCOUNTER — Encounter (HOSPITAL_COMMUNITY): Payer: Self-pay | Admitting: *Deleted

## 2015-01-26 ENCOUNTER — Inpatient Hospital Stay (HOSPITAL_COMMUNITY): Payer: Medicare Other | Admitting: Emergency Medicine

## 2015-01-26 ENCOUNTER — Inpatient Hospital Stay (HOSPITAL_COMMUNITY): Payer: Medicare Other

## 2015-01-26 ENCOUNTER — Inpatient Hospital Stay (HOSPITAL_COMMUNITY)
Admission: RE | Admit: 2015-01-26 | Discharge: 2015-01-27 | DRG: 269 | Disposition: A | Discharge status: Home / Self Care | Payer: Medicare Other | Source: Ambulatory Visit | Attending: Vascular Surgery | Admitting: Vascular Surgery

## 2015-01-26 DIAGNOSIS — I10 Essential (primary) hypertension: Secondary | ICD-10-CM | POA: Diagnosis present

## 2015-01-26 DIAGNOSIS — G8929 Other chronic pain: Secondary | ICD-10-CM | POA: Diagnosis present

## 2015-01-26 DIAGNOSIS — J449 Chronic obstructive pulmonary disease, unspecified: Secondary | ICD-10-CM | POA: Diagnosis present

## 2015-01-26 DIAGNOSIS — Z87891 Personal history of nicotine dependence: Secondary | ICD-10-CM | POA: Diagnosis not present

## 2015-01-26 DIAGNOSIS — Z7982 Long term (current) use of aspirin: Secondary | ICD-10-CM | POA: Diagnosis not present

## 2015-01-26 DIAGNOSIS — Z8249 Family history of ischemic heart disease and other diseases of the circulatory system: Secondary | ICD-10-CM | POA: Diagnosis not present

## 2015-01-26 DIAGNOSIS — Z419 Encounter for procedure for purposes other than remedying health state, unspecified: Secondary | ICD-10-CM

## 2015-01-26 DIAGNOSIS — E785 Hyperlipidemia, unspecified: Secondary | ICD-10-CM | POA: Diagnosis present

## 2015-01-26 DIAGNOSIS — I714 Abdominal aortic aneurysm, without rupture, unspecified: Secondary | ICD-10-CM

## 2015-01-26 DIAGNOSIS — Z86718 Personal history of other venous thrombosis and embolism: Secondary | ICD-10-CM | POA: Diagnosis not present

## 2015-01-26 DIAGNOSIS — Z7951 Long term (current) use of inhaled steroids: Secondary | ICD-10-CM | POA: Diagnosis not present

## 2015-01-26 DIAGNOSIS — M549 Dorsalgia, unspecified: Secondary | ICD-10-CM | POA: Diagnosis present

## 2015-01-26 HISTORY — PX: ABDOMINAL AORTIC ENDOVASCULAR STENT GRAFT: SHX5707

## 2015-01-26 LAB — CBC
HCT: 38.3 % — ABNORMAL LOW (ref 39.0–52.0)
Hemoglobin: 13.4 g/dL (ref 13.0–17.0)
MCH: 32.9 pg (ref 26.0–34.0)
MCHC: 35 g/dL (ref 30.0–36.0)
MCV: 94.1 fL (ref 78.0–100.0)
Platelets: 154 10*3/uL (ref 150–400)
RBC: 4.07 MIL/uL — ABNORMAL LOW (ref 4.22–5.81)
RDW: 13.3 % (ref 11.5–15.5)
WBC: 8.5 10*3/uL (ref 4.0–10.5)

## 2015-01-26 LAB — BASIC METABOLIC PANEL
Anion gap: 7 (ref 5–15)
BUN: 18 mg/dL (ref 6–23)
CHLORIDE: 107 mmol/L (ref 96–112)
CO2: 22 mmol/L (ref 19–32)
Calcium: 8.9 mg/dL (ref 8.4–10.5)
Creatinine, Ser: 1.05 mg/dL (ref 0.50–1.35)
GFR calc Af Amer: 79 mL/min — ABNORMAL LOW (ref >90)
GFR calc non Af Amer: 68 mL/min — ABNORMAL LOW (ref >90)
GLUCOSE: 121 mg/dL — AB (ref 70–99)
Potassium: 4.4 mmol/L (ref 3.5–5.1)
Sodium: 136 mmol/L (ref 135–145)

## 2015-01-26 LAB — APTT: aPTT: 31 seconds (ref 24–37)

## 2015-01-26 LAB — PROTIME-INR
INR: 1.21 (ref 0.00–1.49)
PROTHROMBIN TIME: 15.4 s — AB (ref 11.6–15.2)

## 2015-01-26 LAB — MAGNESIUM: Magnesium: 2 mg/dL (ref 1.5–2.5)

## 2015-01-26 SURGERY — INSERTION, ENDOVASCULAR STENT GRAFT, AORTA, ABDOMINAL
Anesthesia: General | Site: Abdomen

## 2015-01-26 MED ORDER — SODIUM CHLORIDE 0.9 % IV SOLN
500.0000 mL | Freq: Once | INTRAVENOUS | Status: AC | PRN
Start: 2015-01-26 — End: 2015-01-26

## 2015-01-26 MED ORDER — PHENOL 1.4 % MT LIQD: 1.0000 | OROMUCOSAL | Status: DC | PRN

## 2015-01-26 MED ORDER — ENOXAPARIN SODIUM 30 MG/0.3ML ~~LOC~~ SOLN
30.0000 mg | SUBCUTANEOUS | Status: DC
  Filled 2015-01-26: qty 0.3

## 2015-01-26 MED ORDER — PROMETHAZINE HCL 25 MG/ML IJ SOLN
6.2500 mg | INTRAMUSCULAR | Status: DC | PRN
Start: 2015-01-26 — End: 2015-01-26

## 2015-01-26 MED ORDER — PROPOFOL 10 MG/ML IV BOLUS
INTRAVENOUS | Status: AC
  Filled 2015-01-26: qty 20

## 2015-01-26 MED ORDER — DOCUSATE SODIUM 100 MG PO CAPS
100.0000 mg | ORAL_CAPSULE | Freq: Every day | ORAL | Status: DC
Start: 2015-01-27 — End: 2015-01-27
  Administered 2015-01-27: 100 mg via ORAL

## 2015-01-26 MED ORDER — ALUM & MAG HYDROXIDE-SIMETH 200-200-20 MG/5ML PO SUSP
15.0000 mL | ORAL | Status: DC | PRN
  Administered 2015-01-27: 30 mL via ORAL
  Filled 2015-01-26: qty 30

## 2015-01-26 MED ORDER — SODIUM CHLORIDE 0.9 % IR SOLN
Status: DC | PRN
  Administered 2015-01-26: 500 mL

## 2015-01-26 MED ORDER — ACETAMINOPHEN 325 MG PO TABS: 325.0000 mg | ORAL_TABLET | ORAL | Status: DC | PRN

## 2015-01-26 MED ORDER — POTASSIUM CHLORIDE CRYS ER 20 MEQ PO TBCR: 20.0000 meq | EXTENDED_RELEASE_TABLET | Freq: Every day | ORAL | Status: DC | PRN

## 2015-01-26 MED ORDER — FENTANYL CITRATE 0.05 MG/ML IJ SOLN
25.0000 ug | INTRAMUSCULAR | Status: DC | PRN
  Administered 2015-01-26: 50 ug via INTRAVENOUS

## 2015-01-26 MED ORDER — TRIAMCINOLONE ACETONIDE 0.1 % EX OINT: 1.0000 "application " | TOPICAL_OINTMENT | CUTANEOUS | Status: DC | PRN

## 2015-01-26 MED ORDER — FENTANYL CITRATE 0.05 MG/ML IJ SOLN
INTRAMUSCULAR | Status: AC
  Filled 2015-01-26: qty 5

## 2015-01-26 MED ORDER — PROTAMINE SULFATE 10 MG/ML IV SOLN
INTRAVENOUS | Status: AC
  Filled 2015-01-26: qty 5

## 2015-01-26 MED ORDER — ATORVASTATIN CALCIUM 20 MG PO TABS
20.0000 mg | ORAL_TABLET | Freq: Every day | ORAL | Status: DC
  Administered 2015-01-26: 20 mg via ORAL
  Filled 2015-01-26 (×2): qty 1

## 2015-01-26 MED ORDER — ONDANSETRON HCL 4 MG/2ML IJ SOLN
INTRAMUSCULAR | Status: DC | PRN
  Administered 2015-01-26: 4 mg via INTRAVENOUS

## 2015-01-26 MED ORDER — FINASTERIDE 5 MG PO TABS
5.0000 mg | ORAL_TABLET | Freq: Every day | ORAL | Status: DC
  Administered 2015-01-26: 5 mg via ORAL
  Filled 2015-01-26 (×2): qty 1

## 2015-01-26 MED ORDER — NEOSTIGMINE METHYLSULFATE 10 MG/10ML IV SOLN
INTRAVENOUS | Status: AC
  Filled 2015-01-26: qty 1

## 2015-01-26 MED ORDER — SUMATRIPTAN SUCCINATE 100 MG PO TABS
100.0000 mg | ORAL_TABLET | ORAL | Status: DC | PRN
  Filled 2015-01-26: qty 1

## 2015-01-26 MED ORDER — LABETALOL HCL 5 MG/ML IV SOLN
INTRAVENOUS | Status: DC | PRN
  Administered 2015-01-26 (×2): 5 mg via INTRAVENOUS

## 2015-01-26 MED ORDER — GLYCOPYRROLATE 0.2 MG/ML IJ SOLN
INTRAMUSCULAR | Status: AC
  Filled 2015-01-26: qty 3

## 2015-01-26 MED ORDER — CLOBETASOL PROPIONATE 0.05 % EX CREA: 1.0000 "application " | TOPICAL_CREAM | Freq: Two times a day (BID) | CUTANEOUS | Status: DC | PRN

## 2015-01-26 MED ORDER — LACTATED RINGERS IV SOLN
INTRAVENOUS | Status: DC
  Administered 2015-01-26: 11:00:00 via INTRAVENOUS

## 2015-01-26 MED ORDER — ALBUTEROL SULFATE HFA 108 (90 BASE) MCG/ACT IN AERS
INHALATION_SPRAY | RESPIRATORY_TRACT | Status: DC | PRN
  Administered 2015-01-26: 6 via RESPIRATORY_TRACT
  Administered 2015-01-26: 8 via RESPIRATORY_TRACT

## 2015-01-26 MED ORDER — ACETAMINOPHEN 650 MG RE SUPP: 325.0000 mg | RECTAL | Status: DC | PRN

## 2015-01-26 MED ORDER — HYDRALAZINE HCL 20 MG/ML IJ SOLN: 5.0000 mg | INTRAMUSCULAR | Status: DC | PRN

## 2015-01-26 MED ORDER — GUAIFENESIN-DM 100-10 MG/5ML PO SYRP: 15.0000 mL | ORAL_SOLUTION | ORAL | Status: DC | PRN

## 2015-01-26 MED ORDER — HEPARIN SODIUM (PORCINE) 1000 UNIT/ML IJ SOLN
INTRAMUSCULAR | Status: DC | PRN
  Administered 2015-01-26: 1000 [IU] via INTRAVENOUS

## 2015-01-26 MED ORDER — GLYCOPYRROLATE 0.2 MG/ML IJ SOLN
INTRAMUSCULAR | Status: DC | PRN
  Administered 2015-01-26: 0.6 mg via INTRAVENOUS

## 2015-01-26 MED ORDER — ONDANSETRON HCL 4 MG/2ML IJ SOLN: 4.0000 mg | Freq: Four times a day (QID) | INTRAMUSCULAR | Status: DC | PRN

## 2015-01-26 MED ORDER — SODIUM CHLORIDE 0.9 % IJ SOLN
INTRAMUSCULAR | Status: AC
  Filled 2015-01-26: qty 10

## 2015-01-26 MED ORDER — DEXAMETHASONE SODIUM PHOSPHATE 10 MG/ML IJ SOLN
INTRAMUSCULAR | Status: AC
  Filled 2015-01-26: qty 1

## 2015-01-26 MED ORDER — OXYCODONE HCL 5 MG PO TABS
5.0000 mg | ORAL_TABLET | ORAL | Status: DC | PRN
  Administered 2015-01-26: 10 mg via ORAL
  Filled 2015-01-26: qty 2

## 2015-01-26 MED ORDER — DOXAZOSIN MESYLATE 4 MG PO TABS
4.0000 mg | ORAL_TABLET | Freq: Every day | ORAL | Status: DC
  Administered 2015-01-26: 4 mg via ORAL
  Filled 2015-01-26 (×2): qty 1

## 2015-01-26 MED ORDER — LACTATED RINGERS IV SOLN
INTRAVENOUS | Status: DC | PRN
  Administered 2015-01-26 (×2): via INTRAVENOUS

## 2015-01-26 MED ORDER — IODIXANOL 320 MG/ML IV SOLN
INTRAVENOUS | Status: DC | PRN
  Administered 2015-01-26: 125 mL

## 2015-01-26 MED ORDER — PROPOFOL 10 MG/ML IV BOLUS
INTRAVENOUS | Status: DC | PRN
  Administered 2015-01-26: 90 mg via INTRAVENOUS

## 2015-01-26 MED ORDER — EPHEDRINE SULFATE 50 MG/ML IJ SOLN
INTRAMUSCULAR | Status: DC | PRN
  Administered 2015-01-26 (×2): 10 mg via INTRAVENOUS

## 2015-01-26 MED ORDER — PHENYLEPHRINE HCL 10 MG/ML IJ SOLN
10.0000 mg | INTRAVENOUS | Status: DC | PRN
  Administered 2015-01-26: 10 ug/min via INTRAVENOUS

## 2015-01-26 MED ORDER — FENTANYL CITRATE 0.05 MG/ML IJ SOLN
INTRAMUSCULAR | Status: AC
  Filled 2015-01-26: qty 2

## 2015-01-26 MED ORDER — ONDANSETRON HCL 4 MG/2ML IJ SOLN
INTRAMUSCULAR | Status: AC
  Filled 2015-01-26: qty 2

## 2015-01-26 MED ORDER — FLUTICASONE PROPIONATE HFA 44 MCG/ACT IN AERO
2.0000 | INHALATION_SPRAY | RESPIRATORY_TRACT | Status: DC
  Administered 2015-01-27: 2 via RESPIRATORY_TRACT
  Filled 2015-01-26: qty 10.6

## 2015-01-26 MED ORDER — SODIUM CHLORIDE 0.9 % IV SOLN: INTRAVENOUS | Status: DC

## 2015-01-26 MED ORDER — METOPROLOL TARTRATE 1 MG/ML IV SOLN: 2.0000 mg | INTRAVENOUS | Status: DC | PRN

## 2015-01-26 MED ORDER — FENTANYL CITRATE 0.05 MG/ML IJ SOLN
INTRAMUSCULAR | Status: DC | PRN
  Administered 2015-01-26 (×2): 50 ug via INTRAVENOUS

## 2015-01-26 MED ORDER — EPHEDRINE SULFATE 50 MG/ML IJ SOLN
INTRAMUSCULAR | Status: AC
  Filled 2015-01-26: qty 1

## 2015-01-26 MED ORDER — LIDOCAINE HCL (CARDIAC) 20 MG/ML IV SOLN
INTRAVENOUS | Status: DC | PRN
  Administered 2015-01-26: 60 mg via INTRAVENOUS

## 2015-01-26 MED ORDER — LABETALOL HCL 5 MG/ML IV SOLN: 10.0000 mg | INTRAVENOUS | Status: DC | PRN

## 2015-01-26 MED ORDER — MORPHINE SULFATE 2 MG/ML IJ SOLN
2.0000 mg | INTRAMUSCULAR | Status: DC | PRN
  Administered 2015-01-27: 2 mg via INTRAVENOUS
  Filled 2015-01-26: qty 1

## 2015-01-26 MED ORDER — ASPIRIN 325 MG PO TABS
325.0000 mg | ORAL_TABLET | Freq: Every morning | ORAL | Status: DC
  Administered 2015-01-27: 325 mg via ORAL
  Filled 2015-01-26: qty 1

## 2015-01-26 MED ORDER — ALBUTEROL SULFATE HFA 108 (90 BASE) MCG/ACT IN AERS: 2.0000 | INHALATION_SPRAY | Freq: Four times a day (QID) | RESPIRATORY_TRACT | Status: DC | PRN

## 2015-01-26 MED ORDER — DEXTROSE 5 % IV SOLN
1.5000 g | Freq: Two times a day (BID) | INTRAVENOUS | Status: AC
  Administered 2015-01-26 – 2015-01-27 (×2): 1.5 g via INTRAVENOUS
  Filled 2015-01-26 (×2): qty 1.5

## 2015-01-26 MED ORDER — NEOSTIGMINE METHYLSULFATE 10 MG/10ML IV SOLN
INTRAVENOUS | Status: DC | PRN
  Administered 2015-01-26: 4 mg via INTRAVENOUS

## 2015-01-26 MED ORDER — HYDROCODONE-ACETAMINOPHEN 5-325 MG PO TABS: 1.0000 | ORAL_TABLET | Freq: Four times a day (QID) | ORAL | Status: DC | PRN

## 2015-01-26 MED ORDER — ROCURONIUM BROMIDE 50 MG/5ML IV SOLN
INTRAVENOUS | Status: AC
  Filled 2015-01-26: qty 1

## 2015-01-26 MED ORDER — 0.9 % SODIUM CHLORIDE (POUR BTL) OPTIME
TOPICAL | Status: DC | PRN
  Administered 2015-01-26: 1000 mL

## 2015-01-26 MED ORDER — GEMFIBROZIL 600 MG PO TABS
600.0000 mg | ORAL_TABLET | Freq: Two times a day (BID) | ORAL | Status: DC
  Administered 2015-01-26 – 2015-01-27 (×2): 600 mg via ORAL
  Filled 2015-01-26 (×4): qty 1

## 2015-01-26 MED ORDER — DEXAMETHASONE SODIUM PHOSPHATE 4 MG/ML IJ SOLN
INTRAMUSCULAR | Status: DC | PRN
  Administered 2015-01-26: 10 mg via INTRAVENOUS

## 2015-01-26 MED ORDER — HYDROCODONE-ACETAMINOPHEN 5-325 MG PO TABS
1.0000 | ORAL_TABLET | Freq: Four times a day (QID) | ORAL | Status: DC | PRN
  Administered 2015-01-26: 2 via ORAL
  Filled 2015-01-26: qty 2

## 2015-01-26 MED ORDER — PROTAMINE SULFATE 10 MG/ML IV SOLN
INTRAVENOUS | Status: DC | PRN
  Administered 2015-01-26: 10 mg via INTRAVENOUS
  Administered 2015-01-26: 90 mg via INTRAVENOUS

## 2015-01-26 MED ORDER — FLUTICASONE PROPIONATE 50 MCG/ACT NA SUSP
2.0000 | Freq: Every day | NASAL | Status: DC
  Filled 2015-01-26: qty 16

## 2015-01-26 MED ORDER — PANTOPRAZOLE SODIUM 40 MG PO TBEC
40.0000 mg | DELAYED_RELEASE_TABLET | Freq: Every day | ORAL | Status: DC
  Administered 2015-01-27: 40 mg via ORAL
  Filled 2015-01-26: qty 1

## 2015-01-26 MED ORDER — ROCURONIUM BROMIDE 100 MG/10ML IV SOLN
INTRAVENOUS | Status: DC | PRN
  Administered 2015-01-26: 40 mg via INTRAVENOUS

## 2015-01-26 MED ORDER — MIDAZOLAM HCL 2 MG/2ML IJ SOLN
INTRAMUSCULAR | Status: AC
  Filled 2015-01-26: qty 2

## 2015-01-26 SURGICAL SUPPLY — 75 items
BAG BANDED W/RUBBER/TAPE 36X54 (MISCELLANEOUS) ×2 IMPLANT
BAG EQP BAND 135X91 W/RBR TAPE (MISCELLANEOUS) ×1
BAG SNAP BAND KOVER 36X36 (MISCELLANEOUS) IMPLANT
CANISTER SUCTION 2500CC (MISCELLANEOUS) ×2 IMPLANT
CATH BEACON 5 .035 65 VANSC3 (CATHETERS) ×2 IMPLANT
CATH BEACON 5.038 65CM KMP-01 (CATHETERS) ×2 IMPLANT
CATH HEADHUNTER 5FR 65CM (MISCELLANEOUS) ×2 IMPLANT
CATH OMNI FLUSH .035X70CM (CATHETERS) ×2 IMPLANT
CATH VANSCH 5FR 6CM (CATHETERS) ×2 IMPLANT
CLIP TI MEDIUM 24 (CLIP) IMPLANT
CLIP TI WIDE RED SMALL 24 (CLIP) IMPLANT
COVER DOME SNAP 22 D (MISCELLANEOUS) IMPLANT
COVER MAYO STAND STRL (DRAPES) ×2 IMPLANT
COVER PROBE W GEL 5X96 (DRAPES) ×2 IMPLANT
COVER SURGICAL LIGHT HANDLE (MISCELLANEOUS) ×2 IMPLANT
DEVICE CLOSURE PERCLS PRGLD 6F (VASCULAR PRODUCTS) ×4 IMPLANT
DEVICE TORQUE KENDALL .025-038 (MISCELLANEOUS) ×2 IMPLANT
DRAPE C-ARM 42X72 X-RAY (DRAPES) ×2 IMPLANT
DRAPE TABLE COVER HEAVY DUTY (DRAPES) ×2 IMPLANT
DRSG TEGADERM 2-3/8X2-3/4 SM (GAUZE/BANDAGES/DRESSINGS) ×4 IMPLANT
ELECT CAUTERY BLADE 6.4 (BLADE) ×2 IMPLANT
ELECT REM PT RETURN 9FT ADLT (ELECTROSURGICAL) ×4
ELECTRODE REM PT RTRN 9FT ADLT (ELECTROSURGICAL) ×2 IMPLANT
EXCLUDER TNK LEG 26MX12X16 (Endovascular Graft) ×1 IMPLANT
EXCLUDER TRUNK LEG 26MX12X16 (Endovascular Graft) ×2 IMPLANT
GAUZE SPONGE 2X2 8PLY STRL LF (GAUZE/BANDAGES/DRESSINGS) ×2 IMPLANT
GLOVE BIO SURGEON STRL SZ 6.5 (GLOVE) ×2 IMPLANT
GLOVE BIO SURGEON STRL SZ7.5 (GLOVE) ×2 IMPLANT
GLOVE BIOGEL PI IND STRL 6.5 (GLOVE) ×1 IMPLANT
GLOVE BIOGEL PI INDICATOR 6.5 (GLOVE) ×1
GLOVE SS BIOGEL STRL SZ 7 (GLOVE) ×1 IMPLANT
GLOVE SUPERSENSE BIOGEL SZ 7 (GLOVE) ×1
GOWN STRL REUS W/ TWL LRG LVL3 (GOWN DISPOSABLE) ×3 IMPLANT
GOWN STRL REUS W/ TWL XL LVL3 (GOWN DISPOSABLE) ×1 IMPLANT
GOWN STRL REUS W/TWL LRG LVL3 (GOWN DISPOSABLE) ×6
GOWN STRL REUS W/TWL XL LVL3 (GOWN DISPOSABLE) ×2
GRAFT BALLN CATH 65CM (STENTS) ×1 IMPLANT
GRAFT EXCLUDER LEG 12X12 (Endovascular Graft) ×2 IMPLANT
GUIDEWIRE ANGLED .035X150CM (WIRE) ×2 IMPLANT
INSERT FOGARTY 61MM (MISCELLANEOUS) IMPLANT
INSERT FOGARTY SM (MISCELLANEOUS) IMPLANT
KIT BASIN OR (CUSTOM PROCEDURE TRAY) ×2 IMPLANT
KIT ROOM TURNOVER OR (KITS) ×2 IMPLANT
LIQUID BAND (GAUZE/BANDAGES/DRESSINGS) ×4 IMPLANT
NEEDLE PERC 18GX7CM (NEEDLE) ×2 IMPLANT
NS IRRIG 1000ML POUR BTL (IV SOLUTION) ×2 IMPLANT
PACK ENDOVASCULAR (PACKS) ×2 IMPLANT
PAD ARMBOARD 7.5X6 YLW CONV (MISCELLANEOUS) ×4 IMPLANT
PENCIL BUTTON HOLSTER BLD 10FT (ELECTRODE) IMPLANT
PERCLOSE PROGLIDE 6F (VASCULAR PRODUCTS) ×8
PROBE PENCIL 8 MHZ STRL DISP (MISCELLANEOUS) IMPLANT
PROTECTION STATION PRESSURIZED (MISCELLANEOUS)
SHEATH AVANTI 11CM 8FR (MISCELLANEOUS) ×2 IMPLANT
SHEATH BRITE TIP 8FR 23CM (MISCELLANEOUS) ×4 IMPLANT
SPONGE GAUZE 2X2 STER 10/PKG (GAUZE/BANDAGES/DRESSINGS) ×2
SPONGE SURGIFOAM ABS GEL 100 (HEMOSTASIS) IMPLANT
STAPLER VISISTAT 35W (STAPLE) IMPLANT
STATION PROTECTION PRESSURIZED (MISCELLANEOUS) IMPLANT
STENT GRAFT BALLN CATH 65CM (STENTS) ×1
STOPCOCK MORSE 400PSI 3WAY (MISCELLANEOUS) ×2 IMPLANT
SUT PROLENE 5 0 C 1 24 (SUTURE) IMPLANT
SUT VIC AB 2-0 CT1 27 (SUTURE)
SUT VIC AB 2-0 CT1 TAPERPNT 27 (SUTURE) IMPLANT
SUT VIC AB 3-0 SH 27 (SUTURE)
SUT VIC AB 3-0 SH 27X BRD (SUTURE) IMPLANT
SUT VICRYL 4-0 PS2 18IN ABS (SUTURE) ×4 IMPLANT
SYR 20CC LL (SYRINGE) ×4 IMPLANT
SYR 30ML LL (SYRINGE) IMPLANT
SYR 5ML LL (SYRINGE) ×2 IMPLANT
SYR MEDRAD MARK V 150ML (SYRINGE) ×2 IMPLANT
SYRINGE 10CC LL (SYRINGE) ×6 IMPLANT
TRAY FOLEY CATH 16FRSI W/METER (SET/KITS/TRAYS/PACK) ×2 IMPLANT
TUBING HIGH PRESSURE 120CM (CONNECTOR) ×2 IMPLANT
WIRE AMPLATZ SS-J .035X180CM (WIRE) ×4 IMPLANT
WIRE BENTSON .035X145CM (WIRE) IMPLANT

## 2015-01-26 NOTE — Anesthesia Postprocedure Evaluation (Signed)
  Anesthesia Post-op Note  Patient: Reginald [name]  Procedure(s) Performed: Procedure(s) (LRB): ABDOMINAL AORTIC ENDOVASCULAR STENT GRAFT (N/A)  Patient Location: PACU  Anesthesia Type: General  Level of Consciousness: awake and alert   Airway and Oxygen Therapy: Patient Spontanous Breathing  Post-op Pain: mild  Post-op Assessment: Post-op Vital signs reviewed, Patient's Cardiovascular Status Stable, Respiratory Function Stable, Patent Airway and No signs of Nausea or vomiting  Last Vitals:  Filed Vitals:   01/26/15 1530  BP: 117/60  Pulse: 62  Temp:   Resp: 17    Post-op Vital Signs: stable   Complications: No apparent anesthesia complications

## 2015-01-26 NOTE — Progress Notes (Addendum)
Pharmacy Consult: Antibiotics renal dose adjustment  18 YOM s/p abdominal aortic endovascular stent graft, getting zinacef for post-op prophylaxis. Pharmacy is consulted for antibiotics renal dose adjustment. Baseline Scr 1.05, est. crcl 70 ml/min. He received pre-op dose at 1200. Afebrile, wbc wnl.  Plan: Zinacef 1.5 g IV Q 12 hrs x 2 doses as ordered, no adjustment needed. First dose 2200 Pharmacy sign off.   Thanks.  Maryanna Shape, PharmD, BCPS  Clinical Pharmacist  Pager: [phone]

## 2015-01-26 NOTE — Anesthesia Preprocedure Evaluation (Signed)
Anesthesia Evaluation  Patient identified by MRN, date of birth, ID band Patient awake    Reviewed: Allergy & Precautions, NPO status , Patient's Chart, lab work & pertinent test results  Airway Mallampati: II  TM Distance: >3 FB Neck ROM: Full    Dental no notable dental hx.    Pulmonary COPD COPD inhaler, former smoker,  breath sounds clear to auscultation  Pulmonary exam normal       Cardiovascular + Peripheral Vascular Disease negative cardio ROS  Rhythm:Regular Rate:Normal     Neuro/Psych negative neurological ROS  negative psych ROS   GI/Hepatic negative GI ROS, Neg liver ROS,   Endo/Other  negative endocrine ROS  Renal/GU negative Renal ROS  negative genitourinary   Musculoskeletal negative musculoskeletal ROS (+)   Abdominal   Peds negative pediatric ROS (+)  Hematology negative hematology ROS (+)   Anesthesia Other Findings   Reproductive/Obstetrics negative OB ROS                             Anesthesia Physical Anesthesia Plan  ASA: III  Anesthesia Plan: General   Post-op Pain Management:    Induction: Intravenous  Airway Management Planned: Oral ETT  Additional Equipment: Arterial line  Intra-op Plan:   Post-operative Plan: Extubation in OR  Informed Consent: I have reviewed the patients History and Physical, chart, labs and discussed the procedure including the risks, benefits and alternatives for the proposed anesthesia with the patient or authorized representative who has indicated his/her understanding and acceptance.   Dental advisory given  Plan Discussed with: CRNA and Surgeon  Anesthesia Plan Comments:         Anesthesia Quick Evaluation

## 2015-01-26 NOTE — Progress Notes (Signed)
  Day of Surgery Note    Subjective:  "I feel like hell".  States his mouth is dry and throat hurts and I feel like I have to pee.  Filed Vitals:   01/26/15 1500  BP: 126/58  Pulse: 59  Temp:   Resp: 11    Incisions:   Bilateral groins are soft without hematoma Extremities:  Palpable DP/PT bilaterally Cardiac:  regular Lungs:  Non labored Abdomen:  Soft, NT/ND   Assessment/Plan:  This is a 74 y.o. male who is s/p EVAR  -pt doing well this afternoon -transfer to [place] when bed available -anticipate discharge tomorrow   Leontine Locket, PA-C 01/26/2015 3:31 PM

## 2015-01-26 NOTE — Progress Notes (Signed)
Utilization review completed.  

## 2015-01-26 NOTE — Op Note (Signed)
Procedure: Samuel Jester Stent graft repair of AAA Preop: AAA Post op: AAA  Operative findings:   #1 Bilateral Proglide closure   #2 28x12x16 cm main body Gore Excluder device delivered via left femoral system   #3 12 x 12 cm right iliac limb contralateral     PROCEDURE DETAIL: After obtaining informed consent the patient was taken to the operating room. The patient was placed in supine position on the operating room table. After induction of general anesthesia and endotracheal intubation a Foley catheter was placed. Next the patient was prepped and draped in usual sterile fashion from the nipples down to the knees. Ultrasound was used to identify the right common femoral artery as well as the femoral bifurcation. An 11 blade was used to make a small neck in the skin over the level of the right common femoral artery. An introducer needle was then used to cannulate the right common femoral artery without difficulty. A 0.035 Bentson wire was then threaded up into the abdominal aorta through the right femoral system. A short 8 [place] dilator was placed over the guidewire the right femoral system. This was used to dilate the tract. The dilator was then removed and a Proglide device inserted over the guidewire into the right femoral system and this was deployed at the 2:00 position. The Proglide device was then removed and an additional Proglide device was brought in operative field and deployed at the 10:00 position. The sutures were kept separate and tagged with suture tags. Next the long 8 [place] sheath was then placed back over the guidewire into the right femoral system and the dilator removed and the sheath thoroughly flushed with heparinized saline. Attention was then turned to the left groin. Again using ultrasound the left common femoral artery was identified. The femoral bifurcation was also identified. A small nick was made in the skin with the 11 blade. A hemostat was used to dilate a tract down  to the artery. An introducer needle was then used to cannulate the left common femoral artery without difficulty. A 0.035 Bentson wire was then threaded up into the abdominal aorta under fluoroscopic guidance. A 8 French dilator was then placed over the wire to dilate the tract. Two Proglide devices were again brought on operative field and these were fired sequentially in the 10:00 position followed by an additional Proglide at the 2:00 position. The Proglide delivery systems were removed and the short 8 French sheath placed back over the guidewire up to the level of the iliac of the aortic bifurcation.  At this point, a 5 [place] Omni flush catheter was placed over the guidewire and the left groin up and the abdominal aorta. An abdominal aortogram was obtained in the AP position to determine level of the left and right renal arteries as well as the internal iliacs. At this point a 26 x 12 x 16 Gore Excluder main body device was selected. The Bentson wire from the right groin was advanced up into the descending thoracic aorta over a Kumpe catheter and the Bentsen wire replaced with an 035 Amplatz wire. A 16 Fr dry seal sheath was placed over the wire up the level near the renal arteries.The main body device was then placed over the Amplatz wire in the right groin and advanced up to the level of the renal arteries.  Magnified views of the renal arteries were performed to make sure that we were not covering these. The top portion of the stent graft was then deployed  with the end of the stent just below the level of the left renal artery and this came over to just below the level of the right renal artery. The main body was delivered all the way down to the contralateral gate. Attention was then turned to the right groin. The Omni flush catheter was pulled down over a guidewire and removed and the Bentson wire placed in position to cannulate the contralateral gate. The Kumpe catheter was placed back over the  guidewire in the right femoral system. A 5 [place] Kumpe catheter was placed over this and this was used to selectively catheterize the contralateral gate after several attempts we switched to a [place] catheter and then and H1 catheter and an 035 glidewire.  After several attempts and manipulation of angles on the C arm we were able to cannulate the gate.  The 5 Fr pigtail was placed back over the wire and formed in the main body. The main body portion of the gate was confirmed by twirling the pigtail catheter. The pigtail  catheter was then placed in a location so that we could use its markers to determine the exact length to the iliac bifurcation. An Amplatz wire was placed back through the pigtail catheter. A retrograde contrast angiogram was performed to determine the level of the left internal iliac artery and a 16 x 11.5 cm iliac limb was selected. The pigtail catheter was removed over the guidewire.  The 8 Fr sheath was exchanged over a wire for a 12 Fr Gore sheath and the 16 x 11.5 cm limb advanced so there was full coverage of the long marker on the contralateral limb. This was then deployed in the usual fashion down to the iliac bifurcation. The delivery system was then removed. The remainder of the ipsilateral iliac limb was also deployed.  A retrograde contrast angiogram was also performed to make sure that the right iliac limb did not cover the right internal iliac artery.   Attention was then turned back to the left iliac system and a Gore aortic balloon placed over the wire up to the level of the proximal end of the stent and this was ballooned to profile. The limb attachment site was also ballooned as well as the distal attachment site. Attention was then turned to the right groin and the balloon was advanced over the guidewire on the right side and the distal attachment site as well as the midportion of iliac limb were also ballooned. The 5 [place] Omni flush catheter was then placed back to the  guidewire on the right side and a completion arteriogram was obtained. This showed no evidence of proximal or distal type I endoleak. There was no type II endoleak. There is no filling of the aneurysm sac.  The left and right renals as well as the bilateral internals were noted to be patent.    At this point the Omni flush catheter was removed over a guidewire. All delivery devices were removed. We then proceeded to remove the large 16 French sheath from the left side with the guidewire in place. With pressure held above and below the insertion site the lateral and medial Proglide closure was secured down.  There was good hemostasis. The guidewire was removed from the left side. Attention was then turned to the right groin. In similar fashion the 12 French sheath was removed and the guidewire left in place. The lateral and medial Proglide was secured over the wire to obtain hemostasis.  There was good  hemostasis and the Bentsen wire was removed from the right groin. The patient had been given 10000 units of heparin before introducing the main body device. This was fully reversed with 100 mg of protamine at the end of the case. Each groin puncture site was then closed with a running 4-0 Vicryl subcuticular stitch.  The patient had bilateral pedal doppler signals at the end of the case.  The patient tolerated procedure well and there were no complications. Instrument sponge and needle counts correct in the case. Patient was awakened in the operating room extubated and taken to the recovery room in stable condition.  Ruta Hinds, MD [facility] Office: [phone] Pager: [phone]

## 2015-01-26 NOTE — Transfer of Care (Signed)
Immediate Anesthesia Transfer of Care Note  Patient: Reginald [name]  Procedure(s) Performed: Procedure(s): ABDOMINAL AORTIC ENDOVASCULAR STENT GRAFT (N/A)  Patient Location: PACU  Anesthesia Type:General  Level of Consciousness: awake, alert  and patient cooperative  Airway & Oxygen Therapy: Patient Spontanous Breathing and Patient connected to face mask oxygen  Post-op Assessment: Report given to RN, Post -op Vital signs reviewed and stable and Patient moving all extremities  Post vital signs: Reviewed and stable  Last Vitals:  Filed Vitals:   01/26/15 1007  BP: 173/85  Pulse: 79  Temp: 36.3 C  Resp: 18    Complications: No apparent anesthesia complications

## 2015-01-26 NOTE — Anesthesia Procedure Notes (Signed)
Procedure Name: Intubation Date/Time: 01/26/2015 11:54 AM Performed by: Octavio Graves Pre-anesthesia Checklist: Patient identified, Timeout performed, Emergency Drugs available, Suction available and Patient being monitored Patient Re-evaluated:Patient Re-evaluated prior to inductionOxygen Delivery Method: Circle system utilized Preoxygenation: Pre-oxygenation with 100% oxygen Intubation Type: IV induction Ventilation: Mask ventilation without difficulty Laryngoscope Size: Mac and 4 Grade View: Grade I Tube type: Subglottic suction tube Tube size: 7.5 mm Number of attempts: 1 Airway Equipment and Method: Stylet Placement Confirmation: ETT inserted through vocal cords under direct vision,  breath sounds checked- equal and bilateral and positive ETCO2 Secured at: 23 cm Tube secured with: Tape Comments: IV induction Rosen - OW by Priscille Kluver- good mask- intubation Evans SRNA- atraumatic- mouth as preop - no teeth- bilat BS Rosen- Albuterol ordered/decadron

## 2015-01-26 NOTE — H&P (View-Only) (Signed)
[facility]  HISTORY AND PHYSICAL   History of Present Illness:  Patient is a 74 y.o. year old male who presents for evaluation of abdominal aortic aneurysm. The patient has been followed with serial ultrasounds over the last several years. When he was first evaluated in 2011 his aneurysm diameter was 3.5 cm. Recent ultrasound done in our office suggested the aneurysm had grown to more than 5 cm. He presents today for further follow-up after CT angiogram for for evaluation. He has chronic back pain. He has noticed no acute changes in this. He denies abdominal pain. Other medical problems include COPD, hypertension, prior right leg DVT. All of these are currently stable. He denies chest pain. He denies shortness of breath. He states he can climb 2 flights of stairs without becoming short of breath. He is on a statin and aspirin.  Past Medical History  Diagnosis Date  . COPD (chronic obstructive pulmonary disease)   . Joint pain   . Bronchitis   . Productive cough   . Asthma   . Blood clot in vein   . AAA (abdominal aortic aneurysm)   . Hypertension   . DVT (deep venous thrombosis) 2005    Right  leg    Past Surgical History  Procedure Laterality Date  . Fracture surgery  2005    Right heel - Fx and NO surgery    Social History History  Substance Use Topics  . Smoking status: Former Smoker -- 0.50 packs/day    Types: Cigarettes    Quit date: 01/12/2012  . Smokeless tobacco: Never Used     Comment: Pt smokes the [employer]  . Alcohol Use: No    Family History Family History  Problem Relation Age of Onset  . Hyperlipidemia Mother   . Hypertension Mother   . Cancer Father     Bladder  . Hyperlipidemia Father   . Hypertension Father   . Hyperlipidemia Sister   . Hypertension Sister   . Heart disease Brother     Heart Disease before age 62  . Heart attack Brother     x's 2  . Hyperlipidemia Brother   . Hypertension Brother      Allergies  No Known Allergies   Current Outpatient Prescriptions  Medication Sig Dispense Refill  . albuterol (PROVENTIL HFA;VENTOLIN HFA) 108 (90 BASE) MCG/ACT inhaler Inhale 2 puffs into the lungs every 6 (six) hours as needed. For shortness of breath.    Marland Kitchen aspirin 325 MG tablet Take 325 mg by mouth every morning.    Marland Kitchen atorvastatin (LIPITOR) 20 MG tablet Take 20 mg by mouth at bedtime.    . clobetasol cream (TEMOVATE) 3.41 % Apply 1 application topically 2 (two) times daily as needed. For dry skin.    Marland Kitchen doxazosin (CARDURA) 4 MG tablet Take 4 mg by mouth at bedtime.     . finasteride (PROSCAR) 5 MG tablet Take 5 mg by mouth at bedtime.     . fluticasone (FLONASE) 50 MCG/ACT nasal spray Place 2 sprays into the nose daily.      . fluticasone (FLOVENT HFA) 44 MCG/ACT inhaler Inhale 2 puffs into the lungs at bedtime.     Marland Kitchen gemfibrozil (LOPID) 600 MG tablet Take 600 mg by mouth 2 (two) times daily.     Marland Kitchen HYDROcodone-acetaminophen (VICODIN) 5-500 MG per tablet Take by mouth every 6 (six) hours as needed for pain (5-325 mg  q 6 hrs). For pain.    Marland Kitchen  SUMAtriptan (IMITREX) 100 MG tablet Take by mouth continuous as needed.     No current facility-administered medications for this visit.    ROS:   General:  No weight loss, Fever, chills  HEENT: No recent headaches, no nasal bleeding, no visual changes, no sore throat  Neurologic: No dizziness, blackouts, seizures. No recent symptoms of stroke or mini- stroke. No recent episodes of slurred speech, or temporary blindness.  Cardiac: No recent episodes of chest pain/pressure, no shortness of breath at rest.  No shortness of breath with exertion.  Denies history of atrial fibrillation or irregular heartbeat  Vascular: No history of rest pain in feet.  No history of claudication.  No history of non-healing ulcer, No history of DVT   Pulmonary: No home oxygen, no productive cough, no hemoptysis,  No asthma or wheezing  Musculoskeletal:  [ ]   Arthritis, [ ]  Low back pain,  [ ]  Joint pain  Hematologic:No history of hypercoagulable state.  No history of easy bleeding.  No history of anemia  Gastrointestinal: No hematochezia or melena,  No gastroesophageal reflux, no trouble swallowing  Urinary: [ ]  chronic Kidney disease, [ ]  on HD - [ ]  MWF or [ ]  TTHS, [ ]  Burning with urination, [ ]  Frequent urination, [ ]  Difficulty urinating;   Skin: No rashes  Psychological: No history of anxiety,  No history of depression   Physical Examination  Filed Vitals:   01/06/15 0838  BP: 139/78  Pulse: 70  Temp: 97.4 F (36.3 C)  TempSrc: Oral  Resp: 16  Height: 5\' 10"  (1.778 m)  Weight: 216 lb 3.2 oz (98.068 kg)  SpO2: 94%    Body mass index is 31.02 kg/(m^2).  General:  Alert and oriented, no acute distress HEENT: Normal Neck: No bruit or JVD Pulmonary: Clear to auscultation bilaterally Cardiac: Regular Rate and Rhythm without murmur Abdomen: Soft, non-tender, non-distended, no mass Skin: No rash Extremity Pulses:  2+ radial, brachial, femoral, dorsalis pedis pulses bilaterally Musculoskeletal: No deformity or edema  Neurologic: Upper and lower extremity motor 5/5 and symmetric  DATA:  CT Angio the abdomen and pelvis images are reviewed today. This shows the aneurysm diameter is now 6.3 cm. There is no obvious iliac involvement. The aneurysm is infrarenal. There is no evidence of rupture. He does have a reasonable neck and looks amenable to stent graft repair.   ASSESSMENT:  I discussed with the patient today that the aneurysm is at a size that is increased risk of rupture. I believe we should consider elective repair at this point. I discussed with the patient today the possibility of open aneurysm repair versus stent graft repair. I recommended primarily stent graft repair. I did discuss with him the need for further follow-up after stent graft repair versus the option of open repair which is a larger operation up front but  less follow-up in the long-term. He thinks at this point he would opt for a stent graft repair. We will have him evaluated from a cardiac risk standpoint and then plan to tentatively repair his aneurysm with a stent graft in late February.   PLAN: See above  Ruta Hinds, MD [facility] [place] Office: [phone] Pager: [phone]

## 2015-01-26 NOTE — Interval H&P Note (Signed)
History and Physical Interval Note:  01/26/2015 11:11 AM  Reginald [name]  has presented today for surgery, with the diagnosis of AAA  I71.4  The various methods of treatment have been discussed with the patient and family. After consideration of risks, benefits and other options for treatment, the patient has consented to  Procedure(s): ABDOMINAL AORTIC ENDOVASCULAR STENT GRAFT (N/A) as a surgical intervention .  The patient's history has been reviewed, patient examined, no change in status, stable for surgery.  I have reviewed the patient's chart and labs.  Questions were answered to the patient's satisfaction.     Carlon Davidson E

## 2015-01-27 ENCOUNTER — Encounter (HOSPITAL_COMMUNITY): Payer: Self-pay | Admitting: Vascular Surgery

## 2015-01-27 LAB — BASIC METABOLIC PANEL
Anion gap: 9 (ref 5–15)
BUN: 17 mg/dL (ref 6–23)
CHLORIDE: 105 mmol/L (ref 96–112)
CO2: 21 mmol/L (ref 19–32)
Calcium: 8.9 mg/dL (ref 8.4–10.5)
Creatinine, Ser: 1.19 mg/dL (ref 0.50–1.35)
GFR calc non Af Amer: 58 mL/min — ABNORMAL LOW (ref >90)
GFR, EST AFRICAN AMERICAN: 68 mL/min — AB (ref >90)
GLUCOSE: 129 mg/dL — AB (ref 70–99)
Potassium: 4.1 mmol/L (ref 3.5–5.1)
Sodium: 135 mmol/L (ref 135–145)

## 2015-01-27 LAB — CBC
HCT: 35.4 % — ABNORMAL LOW (ref 39.0–52.0)
HEMOGLOBIN: 12.3 g/dL — AB (ref 13.0–17.0)
MCH: 32.9 pg (ref 26.0–34.0)
MCHC: 34.7 g/dL (ref 30.0–36.0)
MCV: 94.7 fL (ref 78.0–100.0)
Platelets: 188 10*3/uL (ref 150–400)
RBC: 3.74 MIL/uL — ABNORMAL LOW (ref 4.22–5.81)
RDW: 13.4 % (ref 11.5–15.5)
WBC: 11.3 10*3/uL — ABNORMAL HIGH (ref 4.0–10.5)

## 2015-01-27 NOTE — Progress Notes (Signed)
Pt. Ambulated 300 ft. On room air and tolerated walking well.

## 2015-01-27 NOTE — Progress Notes (Addendum)
  Progress Note    01/27/2015 8:28 AM 1 Day Post-Op  Subjective:  Ready to go home  Afebrile 093'O-671'I systolic HR 45'Y-09'X NSR (60's since 7pm) 95% RA  Filed Vitals:   01/27/15 0700  BP:   Pulse:   Temp: 97.9 F (36.6 C)  Resp:     Physical Exam: Cardiac:  regular Lungs:  Non labored Incisions:  Bilateral groins are soft without hematoma Extremities:  + palpable DP pulses bilaterally Abdomen:  Soft, NT/ND  CBC    Component Value Date/Time   WBC 11.3* 01/27/2015 0415   RBC 3.74* 01/27/2015 0415   HGB 12.3* 01/27/2015 0415   HCT 35.4* 01/27/2015 0415   PLT 188 01/27/2015 0415   MCV 94.7 01/27/2015 0415   MCH 32.9 01/27/2015 0415   MCHC 34.7 01/27/2015 0415   RDW 13.4 01/27/2015 0415    BMET    Component Value Date/Time   NA 135 01/27/2015 0415   K 4.1 01/27/2015 0415   CL 105 01/27/2015 0415   CO2 21 01/27/2015 0415   GLUCOSE 129* 01/27/2015 0415   BUN 17 01/27/2015 0415   CREATININE 1.19 01/27/2015 0415   CREATININE 1.17 12/23/2014 1348   CALCIUM 8.9 01/27/2015 0415   GFRNONAA 58* 01/27/2015 0415   GFRAA 68* 01/27/2015 0415    INR    Component Value Date/Time   INR 1.21 01/26/2015 1445     Intake/Output Summary (Last 24 hours) at 01/27/15 0828 Last data filed at 01/27/15 0600  Gross per 24 hour  Intake   1980 ml  Output   1160 ml  Net    820 ml     Assessment:  74 y.o. male is s/p:  EVAR  1 Day Post-Op  Plan: -pt doing well this am without complaints -foley removed this morning-pt needs to void -pt has ambulated in the hallways -discharge home later this am when he is able to void -f/u with Dr. Oneida Alar in 4 weeks with CTA -DVT prophylaxis:  Heparin scheduled to start this afternoon if pt is still inpatient   Leontine Locket, PA-C [facility] [phone] 01/27/2015 8:28 AM    Abdomen non tender Groins without hematoma Feet pink and warm  Feels ok overall ready to go home D/c later today if able to  pass urine Follow up CT 1 month  Ruta Hinds, MD [facility] [place]: [phone] Pager: [phone]

## 2015-01-27 NOTE — Care Management Note (Signed)
    Page 1 of 1   01/27/2015     2:31:47 PM CARE MANAGEMENT NOTE 01/27/2015  Patient:  Reginald [name], Reginald [name]   Account Number:  [id number]  Date Initiated:  01/27/2015  Documentation initiated by:  Marvetta Gibbons  Subjective/Objective Assessment:   Pt admitted s/p AAA repair     Action/Plan:   PTA pt lived at home   Anticipated DC Date:  01/27/2015   Anticipated DC Plan:  HOME/SELF CARE         Choice offered to / List presented to:             Status of service:  Completed, signed off Medicare Important Message given?  NA - LOS <3 / Initial given by admissions (If response is "NO", the following Medicare IM given date fields will be blank) Date Medicare IM given:   Medicare IM given by:   Date Additional Medicare IM given:   Additional Medicare IM given by:    Discharge Disposition:  HOME/SELF CARE  Per UR Regulation:  Reviewed for med. necessity/level of care/duration of stay  If discussed at [place] of Stay Meetings, dates discussed:    Comments:

## 2015-01-27 NOTE — Discharge Summary (Signed)
[facility] Discharge Summary   Reginald [name] 06-21-1941 74 y.o. male  MRN: [id number]  Admission Date: 01/26/2015  Discharge Date: 01/27/15  Physician: Elam Dutch, MD  Admission Diagnosis: AAA  I71.4   HPI:   This is a 74 y.o. male who presents for evaluation of abdominal aortic aneurysm. The patient has been followed with serial ultrasounds over the last several years. When he was first evaluated in 2011 his aneurysm diameter was 3.5 cm. Recent ultrasound done in our office suggested the aneurysm had grown to more than 5 cm. He presents today for further follow-up after CT angiogram for for evaluation. He has chronic back pain. He has noticed no acute changes in this. He denies abdominal pain. Other medical problems include COPD, hypertension, prior right leg DVT. All of these are currently stable. He denies chest pain. He denies shortness of breath. He states he can climb 2 flights of stairs without becoming short of breath. He is on a statin and aspirin.  Hospital Course:  The patient was admitted to the hospital and taken to the operating room on 01/26/2015 and underwent: Gore excluder Stent graft repair of AAA    The pt tolerated the procedure well and was transported to the PACU in good condition.   By POD 1, he was doing well.  His catheter was removed, he was able to void.  His feet were pink and warm and abdomen soft and non tender.  The remainder of the hospital course consisted of increasing mobilization and increasing intake of solids without difficulty.  CBC    Component Value Date/Time   WBC 11.3* 01/27/2015 0415   RBC 3.74* 01/27/2015 0415   HGB 12.3* 01/27/2015 0415   HCT 35.4* 01/27/2015 0415   PLT 188 01/27/2015 0415   MCV 94.7 01/27/2015 0415   MCH 32.9 01/27/2015 0415   MCHC 34.7 01/27/2015 0415   RDW 13.4 01/27/2015 0415    BMET    Component Value Date/Time   NA 135 01/27/2015 0415   K 4.1 01/27/2015 0415   CL 105 01/27/2015 0415   CO2 21  01/27/2015 0415   GLUCOSE 129* 01/27/2015 0415   BUN 17 01/27/2015 0415   CREATININE 1.19 01/27/2015 0415   CREATININE 1.17 12/23/2014 1348   CALCIUM 8.9 01/27/2015 0415   GFRNONAA 58* 01/27/2015 0415   GFRAA 68* 01/27/2015 0415     Discharge Instructions:   The patient is discharged with extensive instructions on wound care and progressive ambulation.  They are instructed not to drive or perform any heavy lifting until returning to see the physician in his office.  Discharge Instructions    ABDOMINAL PROCEDURE/ANEURYSM REPAIR/AORTO-BIFEMORAL BYPASS:  Call MD for increased abdominal pain; cramping diarrhea; nausea/vomiting    Complete by:  As directed      Call MD for:  redness, tenderness, or signs of infection (pain, swelling, bleeding, redness, odor or green/yellow discharge around incision site)    Complete by:  As directed      Call MD for:  severe or increased pain, loss or decreased feeling  in affected limb(s)    Complete by:  As directed      Call MD for:  temperature >100.5    Complete by:  As directed      Discharge wound care:    Complete by:  As directed   Shower daily with soap and water starting 01/28/15     Driving Restrictions    Complete by:  As directed  No driving for 2 weeks     Lifting restrictions    Complete by:  As directed   No lifting for 4 weeks     Resume previous diet    Complete by:  As directed            Discharge Diagnosis:  AAA  I71.4  Secondary Diagnosis: Patient Active Problem List   Diagnosis Date Noted  . AAA (abdominal aortic aneurysm) 01/26/2015  . Preop cardiovascular exam 01/10/2015  . Hyperlipidemia 01/10/2015  . Abdominal aortic aneurysm 11/29/2011   Past Medical History  Diagnosis Date  . COPD (chronic obstructive pulmonary disease)   . Joint pain   . Asthma   . AAA (abdominal aortic aneurysm)   . Hyperlipidemia   . DVT (deep venous thrombosis) 2005    Right  leg  . Shortness of breath dyspnea     exertion  .  Headache     cluster headaches  . Arthritis        Medication List    TAKE these medications        albuterol 108 (90 BASE) MCG/ACT inhaler  Commonly known as:  PROVENTIL HFA;VENTOLIN HFA  Inhale 2 puffs into the lungs every 6 (six) hours as needed for shortness of breath.     aspirin 325 MG tablet  Take 325 mg by mouth every morning.     atorvastatin 20 MG tablet  Commonly known as:  LIPITOR  Take 20 mg by mouth at bedtime.     clobetasol cream 0.05 %  Commonly known as:  TEMOVATE  Apply 1 application topically 2 (two) times daily as needed (for dry skin).     doxazosin 4 MG tablet  Commonly known as:  CARDURA  Take 4 mg by mouth at bedtime.     finasteride 5 MG tablet  Commonly known as:  PROSCAR  Take 5 mg by mouth at bedtime.     fluticasone 44 MCG/ACT inhaler  Commonly known as:  FLOVENT HFA  Inhale 2 puffs into the lungs every other day.     fluticasone 50 MCG/ACT nasal spray  Commonly known as:  FLONASE  Place 2 sprays into both nostrils daily.     gemfibrozil 600 MG tablet  Commonly known as:  LOPID  Take 600 mg by mouth 2 (two) times daily.     HYDROcodone-acetaminophen 5-325 MG per tablet  Commonly known as:  NORCO/VICODIN  Take 1-2 tablets by mouth every 6 (six) hours as needed for moderate pain.     SUMAtriptan 100 MG tablet  Commonly known as:  IMITREX  Take 100 mg by mouth every 2 (two) hours as needed for migraine or headache.     triamcinolone ointment 0.1 %  Commonly known as:  KENALOG  Apply 1 application topically as needed (for dry skin).        Vicodin #20 No Refill  Disposition: home  Patient's condition: is Good  Follow up: 1. Dr. Oneida Alar in 4 weeks with CTA   Leontine Locket, PA-C [facility] [phone] 01/27/2015  8:34 AM   - For VQI Registry use --- Instructions: Press F2 to tab through selections.  Delete question if not applicable.   Post-op:  Time to Extubation: [x]  In OR, [ ]  < 12 hrs, [ ]   12-24 hrs, [ ]  >=24 hrs Vasopressors Req. Post-op: No MI: No., [ ]  Troponin only, [ ]  EKG or Clinical New Arrhythmia: No CHF: No ICU Stay: 1 day in stepdown  Transfusion: No  If yes, n/a units given  Complications: Resp failure: No., [ ]  Pneumonia, [ ]  Ventilator Chg in renal function: No., [ ]  Inc. Cr > 0.5, [ ]  Temp. Dialysis, [ ]  Permanent dialysis Leg ischemia: No., no Surgery needed, [ ]  Yes, Surgery needed, [ ]  Amputation Bowel ischemia: No., [ ]  Medical Rx, [ ]  Surgical Rx Wound complication: No., [ ]  Superficial separation/infection, [ ]  Return to OR Return to OR: No  Return to OR for bleeding: No Stroke: No., [ ]  Minor, [ ]  Major  Discharge medications: Statin use:  Yes If No: [ ]  For Medical reasons, [ ]  Non-compliant, [ ]  Not-indicated ASA use:  Yes  If No: [ ]  For Medical reasons, [ ]  Non-compliant, [ ]  Not-indicated Plavix use:  No If No: [ ]  For Medical reasons, [ ]  Non-compliant, [ ]  Not-indicated Beta blocker use:  No If No: [ ]  For Medical reasons, [ ]  Non-compliant, [ ]  Not-indicated

## 2015-02-01 ENCOUNTER — Other Ambulatory Visit: Payer: Self-pay | Admitting: *Deleted

## 2015-02-01 DIAGNOSIS — Z48812 Encounter for surgical aftercare following surgery on the circulatory system: Secondary | ICD-10-CM

## 2015-02-01 DIAGNOSIS — I714 Abdominal aortic aneurysm, without rupture, unspecified: Secondary | ICD-10-CM

## 2015-02-02 ENCOUNTER — Telehealth: Payer: Self-pay | Admitting: Vascular Surgery

## 2015-02-02 NOTE — Telephone Encounter (Signed)
-----   Message from Gabriel Earing, [place] sent at 01/26/2015  1:14 PM EST ----- S/p [facility] 01/26/15.  F/u with [facility] in 4 weeks with CTA protocol.  Thanks, Aldona Bar

## 2015-02-02 NOTE — Telephone Encounter (Signed)
Per Bonnies notes, she has notified patient of this appt, dm

## 2015-02-10 ENCOUNTER — Ambulatory Visit
Admission: RE | Admit: 2015-02-10 | Discharge: 2015-02-10 | Disposition: A | Discharge status: Home / Self Care | Payer: Medicare Other | Source: Ambulatory Visit | Attending: Vascular Surgery | Admitting: Vascular Surgery

## 2015-02-10 DIAGNOSIS — I714 Abdominal aortic aneurysm, without rupture, unspecified: Secondary | ICD-10-CM

## 2015-02-10 DIAGNOSIS — Z48812 Encounter for surgical aftercare following surgery on the circulatory system: Secondary | ICD-10-CM

## 2015-02-10 MED ORDER — IOHEXOL 350 MG/ML SOLN
75.0000 mL | Freq: Once | INTRAVENOUS | Status: AC | PRN
  Administered 2015-02-10: 75 mL via INTRAVENOUS

## 2015-02-15 ENCOUNTER — Encounter: Payer: Self-pay | Admitting: Vascular Surgery

## 2015-02-16 ENCOUNTER — Ambulatory Visit (INDEPENDENT_AMBULATORY_CARE_PROVIDER_SITE_OTHER): Payer: Self-pay | Admitting: Vascular Surgery

## 2015-02-16 ENCOUNTER — Encounter: Payer: Self-pay | Admitting: Vascular Surgery

## 2015-02-16 VITALS — BP 144/87 | HR 66 | Resp 18 | Ht 70.0 in | Wt 206.0 lb

## 2015-02-16 DIAGNOSIS — I714 Abdominal aortic aneurysm, without rupture, unspecified: Secondary | ICD-10-CM

## 2015-02-16 DIAGNOSIS — Z48812 Encounter for surgical aftercare following surgery on the circulatory system: Secondary | ICD-10-CM

## 2015-02-16 NOTE — Progress Notes (Signed)
[facility] [place]   History of Present Illness:  Patient is a 74 y.o. male who presents for follow-up evaluation of AAA. He underwent Gore Excluder aneurysm stent graft repair 01/26/2015. The patient denies new abdominal or back pain.  The patient's atherosclerotic risk factors remain hyperlipidemia.  These is currently stable and followed by his primary care physician.   Past Medical History  Diagnosis Date  . COPD (chronic obstructive pulmonary disease)   . Joint pain   . Asthma   . AAA (abdominal aortic aneurysm)   . Hyperlipidemia   . DVT (deep venous thrombosis) 2005    Right  leg  . Shortness of breath dyspnea     exertion  . Headache     cluster headaches  . Arthritis      Past Surgical History  Procedure Laterality Date  . Fracture surgery  2005    Right heel - Fx and NO surgery  . Abdominal aortic endovascular stent graft N/A 01/26/2015    Procedure: ABDOMINAL AORTIC ENDOVASCULAR STENT GRAFT;  Surgeon: Elam Dutch, MD;  Location: [facility] OR;  Service: Vascular;  Laterality: N/A;       Review of Systems:  Neurologic: denies symptoms of TIA, amaurosis, or stroke Cardiac:denies shortness of breath or chest pain Pulmonary: denies cough or wheeze Abdomen: denies abdominal pain nausea or vomiting  History   Social History  . Marital Status: Married    Spouse Name: N/A  . Number of Children: 2  . Years of Education: N/A   Occupational History  . Not on file.   Social History Main Topics  . Smoking status: Former Smoker -- 0.50 packs/day    Types: Cigarettes    Quit date: 01/12/2012  . Smokeless tobacco: Never Used     Comment: Pt smokes the [employer]  . Alcohol Use: No  . Drug Use: No  . Sexual Activity: Not on file   Other Topics Concern  . Not on file   Social History Narrative    No Known Allergies  Current Outpatient Prescriptions on File Prior to Visit  Medication Sig Dispense Refill  . albuterol (PROVENTIL  HFA;VENTOLIN HFA) 108 (90 BASE) MCG/ACT inhaler Inhale 2 puffs into the lungs every 6 (six) hours as needed for shortness of breath.     Marland Kitchen aspirin 325 MG tablet Take 325 mg by mouth every morning.    Marland Kitchen atorvastatin (LIPITOR) 20 MG tablet Take 20 mg by mouth at bedtime.    . clobetasol cream (TEMOVATE) 7.61 % Apply 1 application topically 2 (two) times daily as needed (for dry skin).     Marland Kitchen doxazosin (CARDURA) 4 MG tablet Take 4 mg by mouth at bedtime.     . finasteride (PROSCAR) 5 MG tablet Take 5 mg by mouth at bedtime.     . fluticasone (FLONASE) 50 MCG/ACT nasal spray Place 2 sprays into both nostrils daily.     . fluticasone (FLOVENT HFA) 44 MCG/ACT inhaler Inhale 2 puffs into the lungs every other day.     Marland Kitchen gemfibrozil (LOPID) 600 MG tablet Take 600 mg by mouth 2 (two) times daily.     Marland Kitchen HYDROcodone-acetaminophen (NORCO/VICODIN) 5-325 MG per tablet Take 1-2 tablets by mouth every 6 (six) hours as needed for moderate pain. 20 tablet 0  . SUMAtriptan (IMITREX) 100 MG tablet Take 100 mg by mouth every 2 (two) hours as needed for migraine or headache.     . triamcinolone ointment (KENALOG) 0.1 % Apply  1 application topically as needed (for dry skin).      No current facility-administered medications on file prior to visit.       Physical Examination    Filed Vitals:   02/16/15 0947  BP: 144/87  Pulse: 66  Resp: 18  Height: 5\' 10"  (1.778 m)  Weight: 206 lb (93.441 kg)     General:  Alert and oriented, no acute distress HEENT: Normal Abdomen: Soft, non-tender, non-distended, normal bowel sounds, no pulsatile mass Extremities: 2+ femoral pulses   DATA:  CT angiogram the abdomen and pelvis images were reviewed today. This is from a CT scan from 02/10/2015. Current aneurysm diameter is  6.1 cm.  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 This is subtle.  The top portion of the stent graft is adjacent to the renal arteries and there is no  evidence of migration.   ASSESSMENT:  Doing well status post Gore Excluder stent graft repair aneurysm.  Type II endoleak usually have a benign course we will continue to follow this over time. Patient did have some thickening of his colon. He gets a colonoscopy every 5 years. I will forward a copy of my note to his primary care physician and GI doctor so that they can determine if his follow-up needs to be sooner.   PLAN: Patient will return in 6 months to review his stent graft.   Ruta Hinds, MD [facility] [place] Office: [phone] Pager: [phone]

## 2015-02-17 ENCOUNTER — Encounter: Payer: Medicare Other | Admitting: Vascular Surgery

## 2015-02-17 NOTE — Addendum Note (Signed)
Addended by: Reola Calkins on: 02/17/2015 04:08 PM   Modules accepted: Orders

## 2015-08-22 ENCOUNTER — Other Ambulatory Visit: Payer: Self-pay | Admitting: Vascular Surgery

## 2015-08-22 LAB — BUN: BUN: 24 mg/dL (ref 7–25)

## 2015-08-22 LAB — CREATININE, SERUM: Creat: 1.15 mg/dL (ref 0.70–1.18)

## 2015-08-23 ENCOUNTER — Encounter: Payer: Self-pay | Admitting: Vascular Surgery

## 2015-08-25 ENCOUNTER — Ambulatory Visit (INDEPENDENT_AMBULATORY_CARE_PROVIDER_SITE_OTHER): Payer: Medicare Other | Admitting: Vascular Surgery

## 2015-08-25 ENCOUNTER — Ambulatory Visit
Admission: RE | Admit: 2015-08-25 | Discharge: 2015-08-25 | Disposition: A | Discharge status: Home / Self Care | Payer: Medicare Other | Source: Ambulatory Visit | Attending: Vascular Surgery | Admitting: Vascular Surgery

## 2015-08-25 ENCOUNTER — Encounter: Payer: Self-pay | Admitting: Vascular Surgery

## 2015-08-25 VITALS — BP 120/74 | HR 93 | Temp 98.1°F | Resp 16 | Ht 70.0 in | Wt 217.0 lb

## 2015-08-25 DIAGNOSIS — I714 Abdominal aortic aneurysm, without rupture, unspecified: Secondary | ICD-10-CM

## 2015-08-25 DIAGNOSIS — Z48812 Encounter for surgical aftercare following surgery on the circulatory system: Secondary | ICD-10-CM

## 2015-08-25 MED ORDER — IOPAMIDOL (ISOVUE-370) INJECTION 76%
75.0000 mL | Freq: Once | INTRAVENOUS | Status: AC | PRN
  Administered 2015-08-25: 75 mL via INTRAVENOUS

## 2015-08-25 NOTE — Progress Notes (Signed)
[facility] [place]   History of Present Illness:  Patient is a 74 y.o. male who presents for follow-up evaluation of AAA. He underwent Gore Excluder aneurysm stent graft repair 01/26/2015. The patient denies new abdominal or back pain.  The patient's atherosclerotic risk factors remain hyperlipidemia.  These is currently stable and followed by his primary care physician.    Past Medical History   Diagnosis  Date   .  COPD (chronic obstructive pulmonary disease)     .  Joint pain     .  Asthma     .  AAA (abdominal aortic aneurysm)     .  Hyperlipidemia     .  DVT (deep venous thrombosis)  2005       Right  leg   .  Shortness of breath dyspnea         exertion   .  Headache         cluster headaches   .  Arthritis         Past Surgical History   Procedure  Laterality  Date   .  Fracture surgery    2005       Right heel - Fx and NO surgery   .  Abdominal aortic endovascular stent graft  N/A  01/26/2015       Procedure: ABDOMINAL AORTIC ENDOVASCULAR STENT GRAFT;  Surgeon: Elam Dutch, MD;  Location: [facility] OR;  Service: Vascular;  Laterality: N/A;         Review of Systems:  Neurologic: denies symptoms of TIA, amaurosis, or stroke Cardiac:denies shortness of breath or chest pain Pulmonary: denies cough or wheeze Abdomen: denies abdominal pain nausea or vomiting    History      Social History   .  Marital Status:  Married       Spouse Name:  N/A   .  Number of Children:  2   .  Years of Education:  N/A      Occupational History   .  Not on file.      Social History Main Topics   .  Smoking status:  Former Smoker -- 0.50 packs/day       Types:  Cigarettes       Quit date:  01/12/2012   .  Smokeless tobacco:  Never Used         Comment: Pt smokes the [employer]   .  Alcohol Use:  No   .  Drug Use:  No   .  Sexual Activity:  Not on file      Other Topics  Concern   .  Not on file      Social History Narrative     No Known  Allergies    Current Outpatient Prescriptions on File Prior to Visit   Medication  Sig  Dispense  Refill   .  albuterol (PROVENTIL HFA;VENTOLIN HFA) 108 (90 BASE) MCG/ACT inhaler  Inhale 2 puffs into the lungs every 6 (six) hours as needed for shortness of breath.        Marland Kitchen  aspirin 325 MG tablet  Take 325 mg by mouth every morning.       Marland Kitchen  atorvastatin (LIPITOR) 20 MG tablet  Take 20 mg by mouth at bedtime.       .  clobetasol cream (TEMOVATE) 1.51 %  Apply 1 application topically 2 (two) times daily as needed (for dry skin).        Marland Kitchen  doxazosin (CARDURA) 4 MG tablet  Take 4 mg by mouth at bedtime.        .  finasteride (PROSCAR) 5 MG tablet  Take 5 mg by mouth at bedtime.        .  fluticasone (FLONASE) 50 MCG/ACT nasal spray  Place 2 sprays into both nostrils daily.        .  fluticasone (FLOVENT HFA) 44 MCG/ACT inhaler  Inhale 2 puffs into the lungs every other day.        Marland Kitchen  gemfibrozil (LOPID) 600 MG tablet  Take 600 mg by mouth 2 (two) times daily.        Marland Kitchen  HYDROcodone-acetaminophen (NORCO/VICODIN) 5-325 MG per tablet  Take 1-2 tablets by mouth every 6 (six) hours as needed for moderate pain.  20 tablet  0   .  SUMAtriptan (IMITREX) 100 MG tablet  Take 100 mg by mouth every 2 (two) hours as needed for migraine or headache.        .  triamcinolone ointment (KENALOG) 0.1 %  Apply 1 application topically as needed (for dry skin).           No current facility-administered medications on file prior to visit.         Physical Examination    General:  Alert and oriented, no acute distress HEENT: Normal Abdomen: Soft, non-tender, non-distended, normal bowel sounds, no pulsatile mass Extremities: 2+ femoral pulses   DATA:   CT angiogram the abdomen and pelvis images were reviewed today. This is from a CT scan from Current aneurysm diameter is  4.6cm.  The  type II endoleak is resolved.  The top portion of the stent graft is adjacent to the renal arteries and there is no evidence of  migration.   ASSESSMENT:   Doing well status post Gore Excluder stent graft repair aneurysm.   Patient did have some thickening of his colon. He states he is scheduled for colonoscopy in January.  PLAN: Patient will return in 6 months to review his stent graft.   Ruta Hinds, MD [facility] [place] Office: [phone] Pager: [phone]

## 2015-08-26 NOTE — Addendum Note (Signed)
Addended by: Thresa Ross C on: 08/26/2015 01:01 PM   Modules accepted: Orders

## 2016-02-10 ENCOUNTER — Other Ambulatory Visit: Payer: Self-pay | Admitting: Vascular Surgery

## 2016-02-10 LAB — CREATININE, ISTAT: CREATININE, ISTAT: 1.1 mg/dL (ref 0.6–1.3)

## 2016-02-16 ENCOUNTER — Ambulatory Visit
Admission: RE | Admit: 2016-02-16 | Discharge: 2016-02-16 | Disposition: A | Discharge status: Home / Self Care | Payer: Medicare Other | Source: Ambulatory Visit | Attending: Vascular Surgery | Admitting: Vascular Surgery

## 2016-02-16 DIAGNOSIS — I714 Abdominal aortic aneurysm, without rupture, unspecified: Secondary | ICD-10-CM

## 2016-02-16 MED ORDER — IOPAMIDOL (ISOVUE-370) INJECTION 76%
75.0000 mL | Freq: Once | INTRAVENOUS | Status: AC | PRN
  Administered 2016-02-16: 75 mL via INTRAVENOUS

## 2016-02-22 ENCOUNTER — Encounter: Payer: Self-pay | Admitting: Vascular Surgery

## 2016-02-22 IMAGING — CT CT CTA ABD/PEL W/CM AND/OR W/O CM
3 of 10 series · 10 of 36 positions shown, 15 images · IV contrast (75CC OMNI 350)
Comparison: CT 01/05/2005

CLINICAL DATA: Endovascular repair of an abdominal aortic aneurysm,
surgery performed 01/26/2015.

EXAM:
CT ANGIOGRAPHY ABDOMEN AND PELVIS
TECHNIQUE: Multidetector CT imaging of the abdomen and pelvis was performed
using the standard protocol during bolus administration of
intravenous contrast. Multiplanar reconstructed images including
MIPs were obtained and reviewed to evaluate the vascular anatomy.
CONTRAST:  35 mL Omnipaque 350

[Series 5: angio 2.5 · axial · 0.84mm/px · z∈[-323,-46]mm · 4 of 186 slices shown, 9 images]
[im 38/186  soft-tissue]
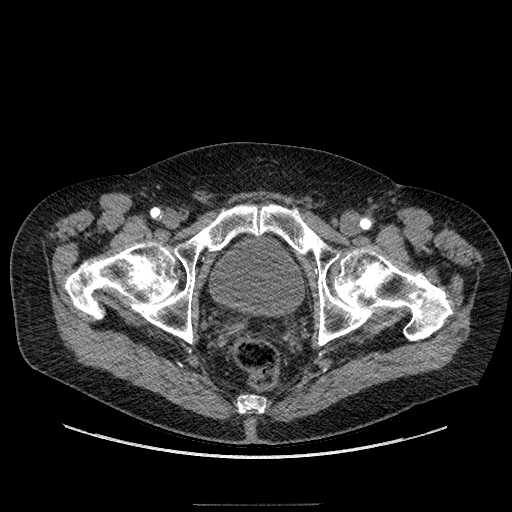
[im 38/186  lung]
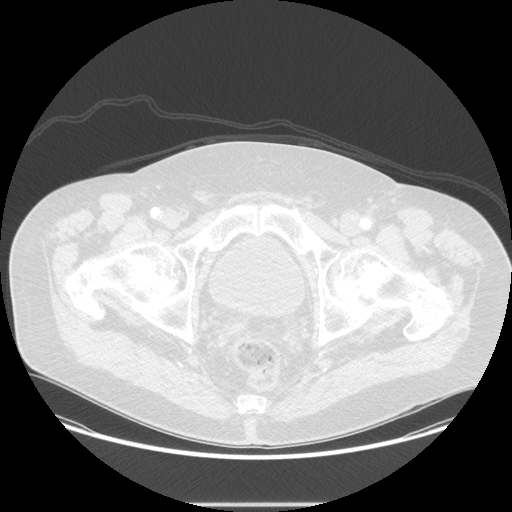
[im 38/186  bone]
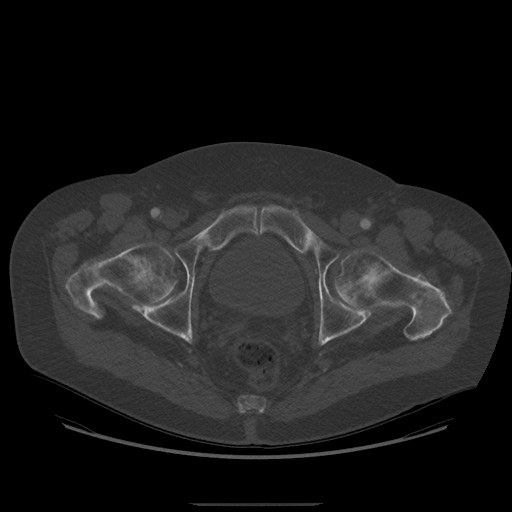
[im 75/186  soft-tissue]
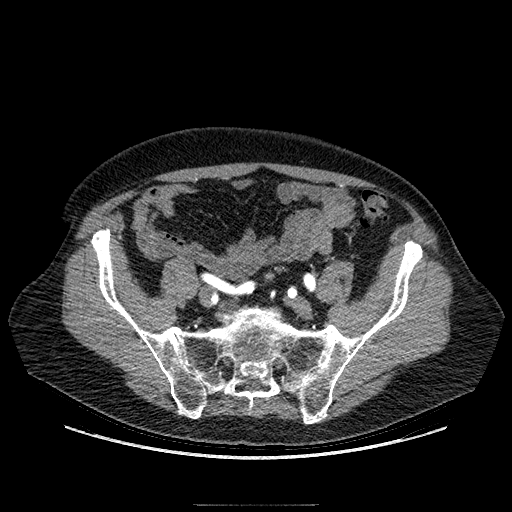
[im 75/186  lung]
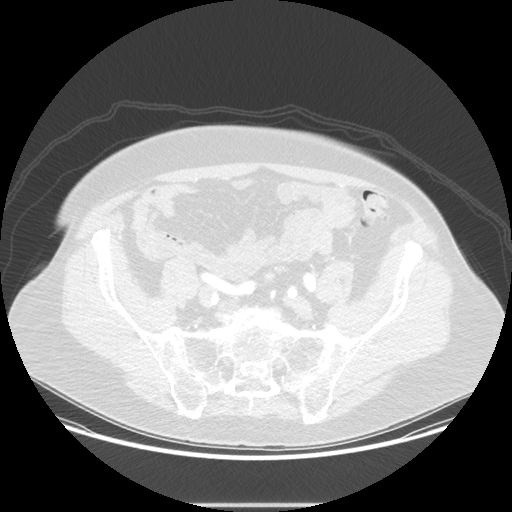
[im 112/186  soft-tissue]
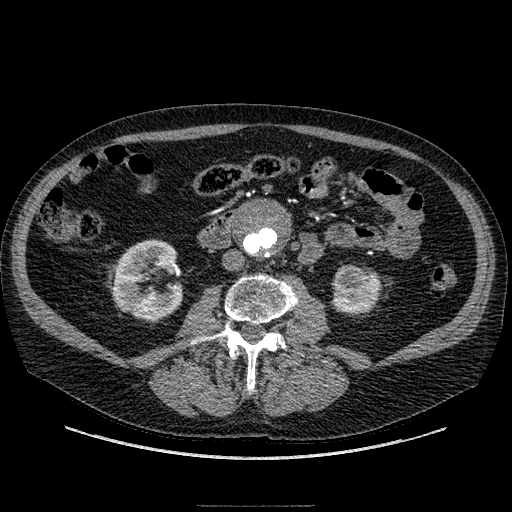
[im 112/186  lung]
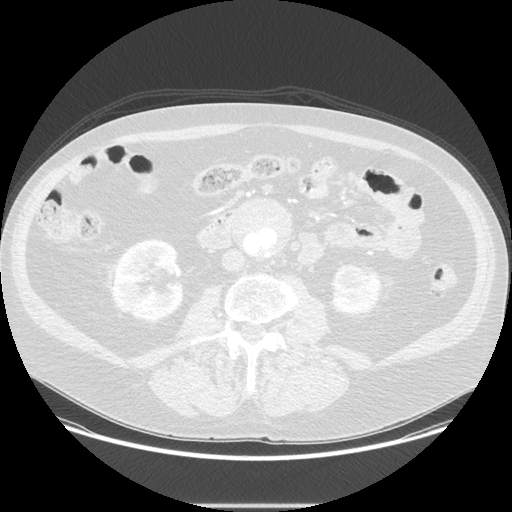
[im 149/186  soft-tissue]
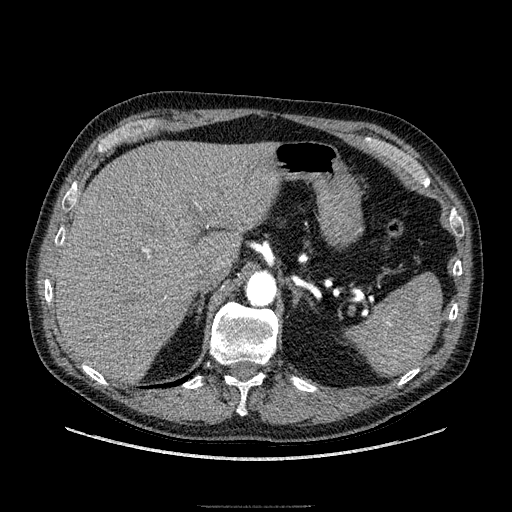
[im 149/186  lung]
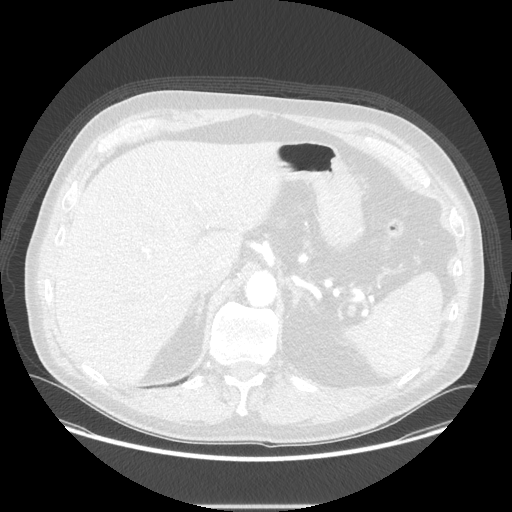

[Series 602: sagittal body · sagittal · 0.90mm/px · 3 of 173 slices shown (1 of 2)]
[im 44/173  soft-tissue]
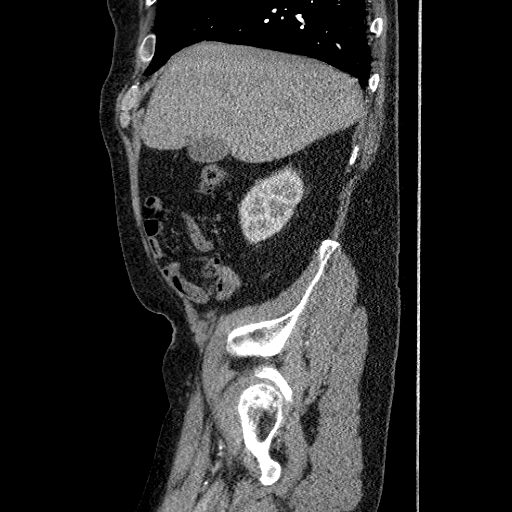
[im 87/173  soft-tissue]
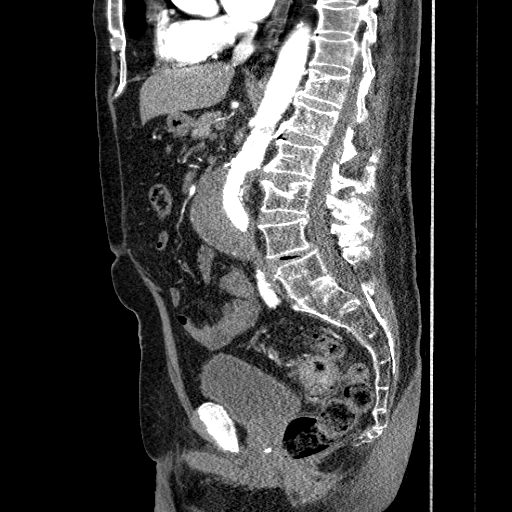
[im 130/173  soft-tissue]
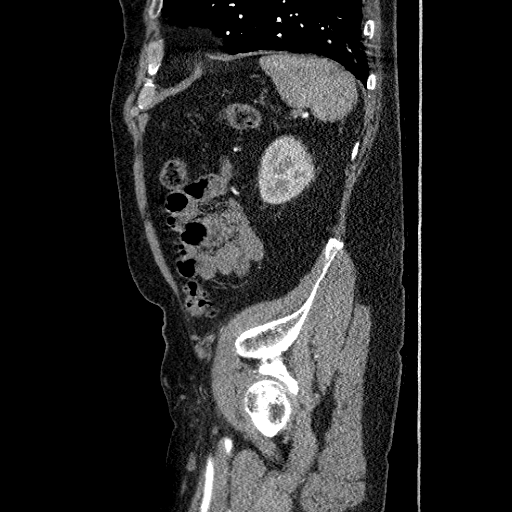

[Series 607: sagittal body · sagittal · 0.84mm/px · 3 of 172 slices shown (2 of 2)]
[im 43/172  soft-tissue]
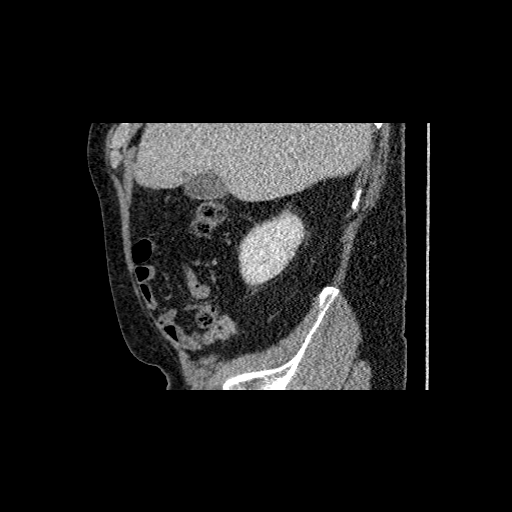
[im 86/172  soft-tissue]
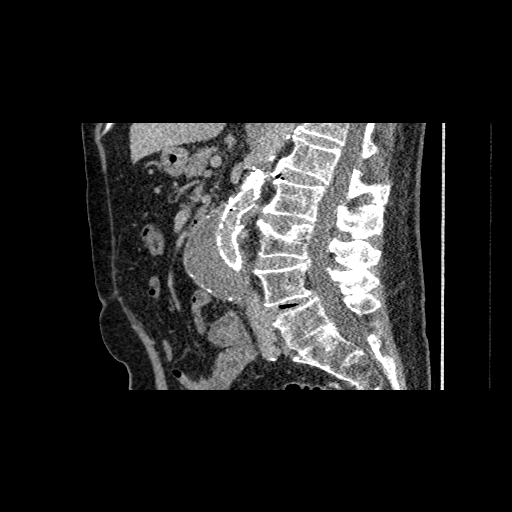
[im 129/172  soft-tissue]
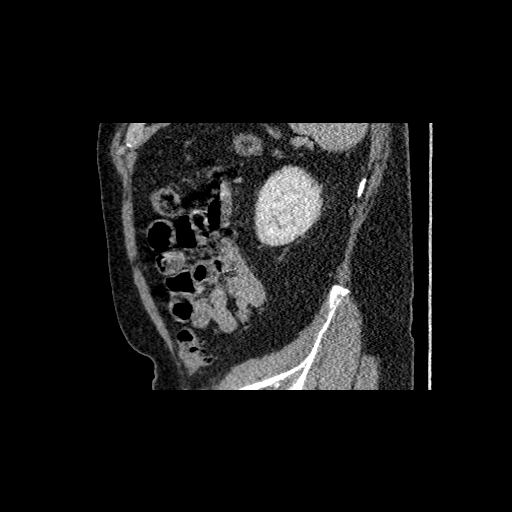

[10 of 36 positions shown; findings below may reference images not displayed]

FINDINGS: ARTERIAL FINDINGS:

Aorta: Placement of an infrarenal abdominal aortic stent graft. The
aortic stent and iliac limbs are widely patent. Aneurysm sac
measures 6.1 x 5.1 cm and preoperative aneurysm measured 6.3 x
cm at a similar level. There is concern for a small type 2 endoleak
along the anterior aspect of the sac on sequence 5, image 70. This
area is just below the duodenum and appears to represent the origin
of the IMA on the preoperative images. No other areas are suspicious
for an endoleak.

Celiac axis: Celiac trunk and main branch vessels are patent.

Superior mesenteric: Superior mesenteric artery is patent.

Left renal: Left renal artery is patent with a small amount of
plaque at the origin. A chronically occluded inferior accessory left
renal artery.

Right renal:  Right renal artery is patent.

Inferior mesenteric: There is reconstitution of the IMA and concern
for a small amount of flow at the origin causing a type 2 endoleak.

Left iliac: Left iliac limb is patent. The graft extends to the
distal common iliac artery. The left external and internal iliac
arteries are patent. The proximal left femoral arteries are patent.

Right iliac: Stent graft extends into the right common iliac artery.
The right internal and external iliac arteries are patent. Proximal
right femoral arteries are patent.

Venous findings: Venous structures are not well opacified but there
is no gross abnormality in the portal venous system or inferior vena
cava.

Review of the MIP images confirms the above findings.

NONVASCULAR FINDINGS:

Again noted are subpleural densities in both lungs. Findings could
represent areas of mild scarring. No evidence for pleural effusions.
Negative for kidney stones or hydronephrosis on the non contrast
images. Again noted is a small hypodensity in the liver adjacent the
gallbladder which probably represents a cyst. Normal appearance of
the liver, gallbladder and spleen. No acute abnormality to the
duodenum, adrenal glands, stomach or pancreas. There is chronic
scarring along the left kidney lower pole likely related to the
occluded accessory left renal artery. There are a few hypodense
structures in both kidneys which are most compatible with cysts.
Some of these hypodense structures are too small to definitively
characterize. There is no significant free fluid or lymphadenopathy.
No gross abnormality to the prostate or urinary bladder. There is
wall thickening and diverticulosis in the sigmoid colon but no acute
inflammation. No acute abnormality in the small or large bowel.
Small umbilical hernia containing fat. No acute bone abnormality.
IMPRESSION: Endovascular repair of the abdominal aortic aneurysm. There is a
small type 2 endoleak at the origin of the IMA. Aneurysm sac has
slightly decreased in size from the preoperative size.

Persistent wall thickening in the sigmoid colon likely related to
diverticulosis. Recommend correlating with colonoscopy history.

Bilateral renal cysts.

## 2016-02-23 ENCOUNTER — Ambulatory Visit: Payer: Medicare Other | Admitting: Vascular Surgery

## 2016-03-01 ENCOUNTER — Encounter: Payer: Self-pay | Admitting: Vascular Surgery

## 2016-03-01 ENCOUNTER — Ambulatory Visit (INDEPENDENT_AMBULATORY_CARE_PROVIDER_SITE_OTHER): Payer: Medicare Other | Admitting: Vascular Surgery

## 2016-03-01 VITALS — BP 143/83 | HR 76 | Temp 97.6°F | Resp 18 | Ht 69.0 in | Wt 215.0 lb

## 2016-03-01 DIAGNOSIS — I714 Abdominal aortic aneurysm, without rupture, unspecified: Secondary | ICD-10-CM

## 2016-03-01 NOTE — Progress Notes (Signed)
History of Present Illness:  Patient is a 75 y.o.  male who presents for evaluation of AAA s/p EVAR 01/27/2016.   He has done well since his surgery from a vascular point of view, but continues to have increasing problems with his lumbar spin.  He has an orthopedic physician he sees on a regular basis.   Other medical problems include has Abdominal aortic aneurysm ([place]); Preop cardiovascular exam; Hyperlipidemia; and AAA (abdominal aortic aneurysm) ([place]) on his problem list.  No change is his medical management since his last appointment.    Past Medical History  Diagnosis Date  . COPD (chronic obstructive pulmonary disease) ([place])   . Joint pain   . Asthma   . AAA (abdominal aortic aneurysm) ([place])   . Hyperlipidemia   . DVT (deep venous thrombosis) ([place]) 2005    Right  leg  . Shortness of breath dyspnea     exertion  . Headache     cluster headaches  . Arthritis     Past Surgical History  Procedure Laterality Date  . Abdominal aortic endovascular stent graft N/A 01/26/2015    Procedure: ABDOMINAL AORTIC ENDOVASCULAR STENT GRAFT;  Surgeon: Elam Dutch, MD;  Location: [facility] OR;  Service: Vascular;  Laterality: N/A;  . Fracture surgery  2005    Right heel - Fx and NO SURGERY    Social History Social History  Substance Use Topics  . Smoking status: Current Every Day Smoker -- 0.25 packs/day    Types: E-cigarettes  . Smokeless tobacco: Never Used     Comment: Pt smokes the Electronic Cigarettes  . Alcohol Use: No    Family History Family History  Problem Relation Age of Onset  . Hyperlipidemia Mother   . Hypertension Mother   . Cancer Father     Bladder  . Hyperlipidemia Father   . Hypertension Father   . Hyperlipidemia Sister   . Hypertension Sister   . Heart disease Brother     Heart Disease before age 57  . Heart attack Brother     x's 2  . Hyperlipidemia Brother   . Hypertension Brother     Allergies  No Known Allergies   Current Outpatient  Prescriptions  Medication Sig Dispense Refill  . albuterol (PROVENTIL HFA;VENTOLIN HFA) 108 (90 BASE) MCG/ACT inhaler Inhale 2 puffs into the lungs every 6 (six) hours as needed for shortness of breath.     Marland Kitchen aspirin 325 MG tablet Take 325 mg by mouth every morning.    Marland Kitchen atorvastatin (LIPITOR) 20 MG tablet Take 20 mg by mouth at bedtime.    . clobetasol cream (TEMOVATE) 2.11 % Apply 1 application topically 2 (two) times daily as needed (for dry skin).     Marland Kitchen doxazosin (CARDURA) 4 MG tablet Take 4 mg by mouth at bedtime.     . finasteride (PROSCAR) 5 MG tablet Take 5 mg by mouth at bedtime.     . fluticasone (FLONASE) 50 MCG/ACT nasal spray Place 2 sprays into both nostrils daily.     . fluticasone (FLOVENT HFA) 44 MCG/ACT inhaler Inhale 2 puffs into the lungs every other day.     Marland Kitchen HYDROcodone-acetaminophen (NORCO/VICODIN) 5-325 MG per tablet Take 1-2 tablets by mouth every 6 (six) hours as needed for moderate pain. 20 tablet 0  . methocarbamol (ROBAXIN) 500 MG tablet Take 500 mg by mouth as needed for muscle spasms.    Marland Kitchen topiramate (TOPAMAX) 50 MG tablet Take 50 mg by  mouth as needed.    . triamcinolone ointment (KENALOG) 0.1 % Apply 1 application topically as needed (for dry skin).     Marland Kitchen gemfibrozil (LOPID) 600 MG tablet Take 600 mg by mouth 2 (two) times daily. Reported on 03/01/2016    . SUMAtriptan (IMITREX) 100 MG tablet Take 100 mg by mouth every 2 (two) hours as needed for migraine or headache. Reported on 03/01/2016     No current facility-administered medications for this visit.    ROS:   General:  No weight loss, Fever, chills  HEENT: No recent headaches, no nasal bleeding, no visual changes, no sore throat  Neurologic: No dizziness, blackouts, seizures. No recent symptoms of stroke or mini- stroke. No recent episodes of slurred speech, or temporary blindness.  Cardiac: No recent episodes of chest pain/pressure, no shortness of breath at rest.  No shortness of breath with  exertion.  Denies history of atrial fibrillation or irregular heartbeat  Vascular: No history of rest pain in feet.  No history of claudication.  No history of non-healing ulcer, No history of DVT   Pulmonary: No home oxygen, no productive cough, no hemoptysis,  No asthma or wheezing  Musculoskeletal:  [ x] Arthritis, [x ] Low back pain,  '[ ]'$  Joint pain  Hematologic:No history of hypercoagulable state.  No history of easy bleeding.  No history of anemia  Gastrointestinal: No hematochezia or melena,  No gastroesophageal reflux, no trouble swallowing  Urinary: '[ ]'$  chronic Kidney disease, '[ ]'$  on HD - '[ ]'$  MWF or '[ ]'$  TTHS, '[ ]'$  Burning with urination, '[ ]'$  Frequent urination, '[ ]'$  Difficulty urinating;   Skin: No rashes  Psychological: No history of anxiety,  No history of depression   Physical Examination  Filed Vitals:   03/01/16 1535  BP: 143/83  Pulse: 76  Temp: 97.6 F (36.4 C)  TempSrc: Oral  Resp: 18  Height: '5\' 9"'$  (1.753 m)  Weight: 215 lb (97.523 kg)  SpO2: 98%    Body mass index is 31.74 kg/(m^2).  General:  Alert and oriented, no acute distress HEENT: Normal Pulmonary:  non labored breathing Abdomen: Soft, non-tender, non-distended, no mass, no scars Musculoskeletal:loss of lumbar lordosis, walks bent forward at the waist to unload his lumbar spine.    Neurologic: Upper and lower extremity motor 5/5 and symmetric Vascular: 2+ femoral pulses  DATA:   CTA  IMPRESSION: 1. Patent infrarenal bifurcated stent graft with no endoleak ; stable 4.6 cm excluded native aneurysm sac.  This was reviewed with Dr. Oneida Alar today.  No endo leak and it has reduced in size from 5.2 pre-op.  ASSESSMENT:   AAA with out rupture s/P EVAR repair 01/26/2015   PLAN:   Activity as tolerates.  He will f/u in 1 year for EVAR ultrasound.  He will [facility] Maxie [facility] at [facility] to discuss his lumbar pain and disability of it for treatment recommendations.     Theda Sers EMMA  [facility] [place] Office: [phone]  The patient was seen today with Dr. Oneida Alar  History and exam findings as above. Patient has had a 10% reduction in size of his aneurysm over the last year. A good seal on CT scan. He will follow-up in one year.  Ruta Hinds, MD [facility] [place] Office: [phone] Pager: [phone]

## 2016-12-31 ENCOUNTER — Encounter: Payer: Self-pay | Admitting: Cardiology

## 2017-02-22 ENCOUNTER — Encounter: Payer: Self-pay | Admitting: Vascular Surgery

## 2017-02-25 NOTE — Addendum Note (Signed)
Addended by: Lianne Cure A on: 02/25/2017 04:35 PM   Modules accepted: Orders

## 2017-03-05 ENCOUNTER — Encounter: Payer: Self-pay | Admitting: Vascular Surgery

## 2017-03-05 ENCOUNTER — Ambulatory Visit (HOSPITAL_COMMUNITY)
Admission: RE | Admit: 2017-03-05 | Discharge: 2017-03-05 | Disposition: A | Discharge status: Home / Self Care | Payer: Medicare Other | Source: Ambulatory Visit | Attending: Vascular Surgery | Admitting: Vascular Surgery

## 2017-03-05 ENCOUNTER — Ambulatory Visit (INDEPENDENT_AMBULATORY_CARE_PROVIDER_SITE_OTHER): Payer: Medicare Other | Admitting: Family

## 2017-03-05 VITALS — BP 147/84 | HR 74 | Temp 97.0°F | Resp 16 | Ht 69.0 in | Wt 215.0 lb

## 2017-03-05 DIAGNOSIS — Z9889 Other specified postprocedural states: Secondary | ICD-10-CM | POA: Diagnosis not present

## 2017-03-05 DIAGNOSIS — Z95828 Presence of other vascular implants and grafts: Secondary | ICD-10-CM

## 2017-03-05 DIAGNOSIS — I714 Abdominal aortic aneurysm, without rupture, unspecified: Secondary | ICD-10-CM

## 2017-03-05 NOTE — Progress Notes (Signed)
[facility] [place]  CC: Follow up s/p EVAR  History of Present Illness  Reginald [name] is a 76 y.o. (1941/10/10) male patient of Dr. Oneida Alar who presents for evaluation of AAA s/p EVAR 01/26/2015.   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  He sees an orthopedic physician on a regular basis.    The patient denies any new back pain, denies abdominal pain.   He denies claudication symptoms with walking, denies non healing wounds.  He denies any hx of stroke or TIA.   Pt Diabetic: No Pt smoker: former smoker, quit in 2013, started in his teens, stated he smoked less than a ppd  He takes a daily 325 mg ASA advised by Dr. Donnetta Hutching when pt had DVT in 2005. He was taking atorvastatin, but was stopped by Dr. Redmond Pulling due to "it just drained me, no energy"   Past Medical History:  Diagnosis Date  . AAA (abdominal aortic aneurysm) ([place])   . Arthritis   . Asthma   . COPD (chronic obstructive pulmonary disease) ([place])   . DVT (deep venous thrombosis) ([place]) 2005   Right  leg  . Headache    cluster headaches  . Hyperlipidemia   . Joint pain   . Shortness of breath dyspnea    exertion   Past Surgical History:  Procedure Laterality Date  . ABDOMINAL AORTIC ENDOVASCULAR STENT GRAFT N/A 01/26/2015   Procedure: ABDOMINAL AORTIC ENDOVASCULAR STENT GRAFT;  Surgeon: Elam Dutch, MD;  Location: [place];  Service: Vascular;  Laterality: N/A;  . FRACTURE SURGERY  2005   Right heel - Fx and NO SURGERY   Social History Social History  Substance Use Topics  . Smoking status: Former Smoker    Types: E-cigarettes    Quit date: 03/05/2012  . Smokeless tobacco: Never Used     Comment: Pt smokes the Electronic Cigarettes  . Alcohol use No   Family History Family History  Problem Relation Age of Onset  . Hyperlipidemia Mother   . Hypertension Mother   . Cancer Father     Bladder   . Hyperlipidemia Father   . Hypertension Father   . Hyperlipidemia Sister   . Hypertension Sister   . Heart disease Brother     Heart Disease before age 64  . Heart attack Brother     x's 2  . Hyperlipidemia Brother   . Hypertension Brother    Current Outpatient Prescriptions on File Prior to Visit  Medication Sig Dispense Refill  . albuterol (PROVENTIL HFA;VENTOLIN HFA) 108 (90 BASE) MCG/ACT inhaler Inhale 2 puffs into the lungs every 6 (six) hours as needed for shortness of breath.     Marland Kitchen aspirin 325 MG tablet Take 325 mg by mouth every morning.    . clobetasol cream (TEMOVATE) 4.17 % Apply 1 application topically 2 (two) times daily as needed (for dry skin).     Marland Kitchen doxazosin (CARDURA) 4 MG tablet Take 4 mg by mouth at bedtime.     . finasteride (PROSCAR) 5 MG tablet Take 5 mg by mouth at bedtime.     . fluticasone (FLONASE) 50 MCG/ACT nasal spray Place 2 sprays into both nostrils daily.     . fluticasone (FLOVENT HFA) 44 MCG/ACT inhaler Inhale 2 puffs into the lungs every other day.     Marland Kitchen HYDROcodone-acetaminophen (NORCO/VICODIN) 5-325 MG per tablet Take 1-2  tablets by mouth every 6 (six) hours as needed for moderate pain. 20 tablet 0  . methocarbamol (ROBAXIN) 500 MG tablet Take 500 mg by mouth as needed for muscle spasms.    . SUMAtriptan (IMITREX) 100 MG tablet Take 100 mg by mouth every 2 (two) hours as needed for migraine or headache. Reported on 03/01/2016    . topiramate (TOPAMAX) 50 MG tablet Take 50 mg by mouth as needed.    . triamcinolone ointment (KENALOG) 0.1 % Apply 1 application topically as needed (for dry skin).      No current facility-administered medications on file prior to visit.    No Known Allergies   ROS: See HPI for pertinent positives and negatives.  Physical Examination  Vitals:   03/05/17 0935  BP: (!) 147/84  Pulse: 74  Resp: 16  Temp: 97 F (36.1 C)  TempSrc: Oral  SpO2: 96%  Weight: 215 lb (97.5 kg)  Height: '5\' 9"'$  (1.753 m)   Body mass  index is 31.75 kg/m.  General: A&O x 3, WD, elderly male.  Pulmonary: Sym exp, respirations are non labored, good air movt, CTAB, no rales, rhonchi, or wheezing.  Cardiac: RRR, Nl S1, S2, no murmur appreciated  Vascular: Vessel Right Left  Radial 2+Palpable 2+Palpable  Carotid  without bruit  without bruit  Aorta Not palpable N/A  Popliteal Not palpable Not palpable  PT 2+Palpable 2+Palpable  DP 2+Palpable 2+Palpable   Gastrointestinal: soft, NTND, -G/R, - HSM, - palpable masses, - CVAT B.  Musculoskeletal: M/S 5/5 throughout, extremities without ischemic changes. Several thick toenails. Moderate kyphosis.   Neurologic: Pain and light touch intact in extremities, Motor exam as listed above   Non-Invasive Vascular Imaging  EVAR Duplex (Date: 03/05/17)  AAA sac size: 4.03 cm x 3.98 cm; Right CIA: 1.58 cm; Left CIA: 1.16 cm  no endoleak detected  CTA Abd/Pelvis Duplex (Date: 02-16-16)  AAA sac size: 4.6 cm  no endoleak detected  Medical Decision Making  TREVAUGHN SCHEAR is a 76 y.o. male who presents s/p EVAR (Date: 01-26-15).  Pt is asymptomatic with decrease in sac size to 4.03 cm today, compared to CTA on 02-16-16 at 4.6 cm.  I discussed with the patient the importance of surveillance of the endograft.  The next endograft duplex will be scheduled for 12 months.  The patient will follow up with [place] in 12 months with these studies.  I emphasized the importance of maximal medical management including strict control of blood pressure, blood glucose, and lipid levels, antiplatelet agents, obtaining regular exercise, and cessation of smoking.   Thank you for allowing [place] to participate in this patient's care.  Clemon Chambers, RN, MSN, FNP-C [facility] [place] Office: [facility] Physician: Early  03/05/2017, 10:19 AM

## 2017-03-06 NOTE — Addendum Note (Signed)
Addended by: Lianne Cure A on: 03/06/2017 09:45 AM   Modules accepted: Orders

## 2018-02-24 ENCOUNTER — Encounter: Payer: Self-pay | Admitting: Family

## 2018-02-24 ENCOUNTER — Ambulatory Visit (HOSPITAL_COMMUNITY)
Admission: RE | Admit: 2018-02-24 | Discharge: 2018-02-24 | Disposition: A | Discharge status: Home / Self Care | Payer: Medicare Other | Source: Ambulatory Visit | Attending: Vascular Surgery | Admitting: Vascular Surgery

## 2018-02-24 ENCOUNTER — Ambulatory Visit (INDEPENDENT_AMBULATORY_CARE_PROVIDER_SITE_OTHER): Payer: Medicare Other | Admitting: Family

## 2018-02-24 VITALS — BP 145/90 | HR 73 | Temp 97.3°F | Resp 17 | Ht 70.0 in | Wt 217.8 lb

## 2018-02-24 DIAGNOSIS — I714 Abdominal aortic aneurysm, without rupture, unspecified: Secondary | ICD-10-CM

## 2018-02-24 DIAGNOSIS — Z95828 Presence of other vascular implants and grafts: Secondary | ICD-10-CM | POA: Diagnosis not present

## 2018-02-24 NOTE — Patient Instructions (Signed)
CT Angiogram A CT angiogram is a procedure to look at the blood vessels in various areas of the body. For this procedure, a large X-ray machine, called a CT scanner, takes detailed pictures of blood vessels that have been injected with a dye (contrast material). A CT angiogram allows your health care provider to see how well blood is flowing to the area of your body that is being checked. Your health care provider will be able to see if there are any problems, such as a blockage. Tell a health care provider about:  Any allergies you have.  All medicines you are taking, including vitamins, herbs, eye drops, creams, and over-the-counter medicines.  Any problems you or family members have had with anesthetic medicines.  Any blood disorders you have.  Any surgeries you have had.  Any medical conditions you have.  Whether you are pregnant or may be pregnant.  Whether you are breastfeeding.  Any anxiety disorders, chronic pain, or other conditions you have that may increase your stress or prevent you from lying still. What are the risks? Generally, this is a safe procedure. However, problems may occur, including:  Infection.  Bleeding.  Allergic reactions to medicines or dyes.  Damage to other structures or organs.  Kidney damage from the dye or contrast that is used.  Increased risk of cancer from radiation exposure. This risk is low. Talk with your health care provider about: ? The risks and benefits of testing. ? How you can receive the lowest dose of radiation.  What happens before the procedure?  Wear comfortable clothing and remove any jewelry.  Follow instructions from your health care provider about eating and drinking. For most people, instructions may include these actions: ? For 12 hours before the test, avoid caffeine. This includes tea, coffee, soda, and energy drinks or pills. ? For 3-4 hours before the test, stop eating or drinking anything but water. ? Stay  well hydrated by continuing to drink water before the exam. This will help to clear the contrast dye from your body after the test.  Ask your health care provider about changing or stopping your regular medicines. This is especially important if you are taking diabetes medicines or blood thinners. What happens during the procedure?  An IV tube will be inserted into one of your veins.  You will be asked to lie on an exam table. This table will slide in and out of the CT machine during the procedure.  Contrast dye will be injected into the IV tube. You might feel warm, or you may get a metallic taste in your mouth.  The table that you are lying on will move into the CT machine tunnel for the scan.  The person running the machine will give you instructions while the scans are being done. You may be asked to: ? Keep your arms above your head. ? Hold your breath. ? Stay very still, even if the table is moving.  When the scanning is complete, you will be moved out of the machine.  The IV tube will be removed. The procedure may vary among health care providers and hospitals. What happens after the procedure?  You might feel warm, or you may get a metallic taste in your mouth.  You may be asked to drink water or other fluids to wash (flush) the contrast material out of your body.  It is up to you to get the results of your procedure. Ask your health care provider, or the department  that is doing the procedure, when your results will be ready. Summary  A CT angiogram is a procedure to look at the blood vessels in various areas of the body.  You will need to stay very still during the exam.  You may be asked to drink water or other fluids to wash (flush) the contrast material out of your body after your scan. This information is not intended to replace advice given to you by your health care provider. Make sure you discuss any questions you have with your health care provider. Document  Released: 07/19/2016 Document Revised: 07/19/2016 Document Reviewed: 07/19/2016 Elsevier Interactive Patient Education  [employer].

## 2018-02-24 NOTE — Progress Notes (Signed)
[facility] [place]  CC: Follow up s/p Endovascular Repair of Abdominal Aortic Aneurysm    History of Present Illness  Reginald [name] is a 77 y.o. (12-10-1940) male patient of Dr. Oneida Alar who presents for evaluation of AAA s/p EVAR 01/26/2015. 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 He sees an orthopedic physician on a regular basis.  The patient denies any new back pain, denies abdominal pain.   He denies claudication symptoms with walking, denies non healing wounds.  He denies any hx of stroke or TIA.   Pt Diabetic: No Pt smoker: former smoker, quit in 2013, started in his teens, stated he smoked less than a ppd  He takes a daily 325 mg ASA advised by Dr. Donnetta Hutching when pt had DVT in 2005. He was taking atorvastatin, but was stopped by Dr. Redmond Pulling due to "it just drained me, no energy"   Past Medical History:  Diagnosis Date  . AAA (abdominal aortic aneurysm) ([place])   . Arthritis   . Asthma   . COPD (chronic obstructive pulmonary disease) ([place])   . DVT (deep venous thrombosis) ([place]) 2005   Right  leg  . Headache    cluster headaches  . Hyperlipidemia   . Joint pain   . Shortness of breath dyspnea    exertion   Past Surgical History:  Procedure Laterality Date  . ABDOMINAL AORTIC ENDOVASCULAR STENT GRAFT N/A 01/26/2015   Procedure: ABDOMINAL AORTIC ENDOVASCULAR STENT GRAFT;  Surgeon: Elam Dutch, MD;  Location: [facility];  Service: Vascular;  Laterality: N/A;  . FRACTURE SURGERY  2005   Right heel - Fx and NO SURGERY   Social History Social History   Tobacco Use  . Smoking status: Former Smoker    Types: E-cigarettes    Last attempt to quit: 03/05/2012    Years since quitting: 5.9  . Smokeless tobacco: Never Used  . Tobacco comment: Pt smokes the Electronic Cigarettes  Substance Use Topics  . Alcohol use: No    Alcohol/week: 0.0 oz  . Drug  use: No   Family History Family History  Problem Relation Age of Onset  . Hyperlipidemia Mother   . Hypertension Mother   . Cancer Father        Bladder  . Hyperlipidemia Father   . Hypertension Father   . Hyperlipidemia Sister   . Hypertension Sister   . Heart disease Brother        Heart Disease before age 56  . Heart attack Brother        x's 2  . Hyperlipidemia Brother   . Hypertension Brother    Current Outpatient Medications on File Prior to Visit  Medication Sig Dispense Refill  . albuterol (PROVENTIL HFA;VENTOLIN HFA) 108 (90 BASE) MCG/ACT inhaler Inhale 2 puffs into the lungs every 6 (six) hours as needed for shortness of breath.     Marland Kitchen aspirin 325 MG tablet Take 325 mg by mouth every morning.    . clobetasol cream (TEMOVATE) 9.56 % Apply 1 application topically 2 (two) times daily as needed (for dry skin).     Marland Kitchen doxazosin (CARDURA) 4 MG tablet Take 4 mg by mouth at bedtime.     . finasteride (PROSCAR) 5 MG tablet Take 5 mg by mouth at bedtime.     . fluticasone (FLONASE) 50 MCG/ACT nasal spray Place 2 sprays into both nostrils  daily.     . fluticasone (FLOVENT HFA) 44 MCG/ACT inhaler Inhale 2 puffs into the lungs every other day.     . methocarbamol (ROBAXIN) 500 MG tablet Take 500 mg by mouth as needed for muscle spasms.    Marland Kitchen topiramate (TOPAMAX) 50 MG tablet Take 50 mg by mouth as needed.    . triamcinolone ointment (KENALOG) 0.1 % Apply 1 application topically as needed (for dry skin).      No current facility-administered medications on file prior to visit.    No Known Allergies   ROS: See HPI for pertinent positives and negatives.  Physical Examination  Vitals:   02/24/18 1033 02/24/18 1036  BP: (!) 151/88 (!) 145/90  Pulse: 73   Resp: 17   Temp: (!) 97.3 F (36.3 C)   TempSrc: Oral   SpO2: 96%   Weight: 217 lb 12.8 oz (98.8 kg)   Height: 5\' 10"  (1.778 m)    Body mass index is 31.25 kg/m.  General: A&O x 3, WD, elderly male.  Pulmonary: Sym  exp, respirations are non labored, good air movt, CTAB, no rales, rhonchi, or wheezing.  Cardiac: RRR, Nl S1, S2, no murmur appreciated  Vascular: Vessel Right Left  Radial 2+Palpable 2+Palpable  Carotid  without bruit  without bruit  Aorta Not palpable N/A  Popliteal Not palpable Not palpable  PT 2+Palpable 2+Palpable  DP 2+Palpable 2+Palpable   Gastrointestinal: soft, NTND, -G/R, - HSM, - palpable masses, - CVAT B.  Musculoskeletal: M/S 5/5 throughout, extremities without ischemic changes. Several thick toenails. Moderate kyphosis.   Neurologic: Pain and light touch intact in extremities, Motor exam as listed above  Skin: No rashes, no ulcers, no cellulitis.    Psychiatric: Normal thought content, mood appropriate for clinical situation.     DATA  EVAR Duplex (Date: 02/24/18):  AAA sac size: 5.0 cm; Right CIA: 1.2 cm; Left CIA: 1.2 cm  no endoleak detected  Previous: (Date: 03/05/17)  AAA sac size: 4.03 cm x 3.98 cm; Right CIA: 1.58 cm; Left CIA: 1.16 cm   CTA Abd/Pelvis Duplex (Date: 02-16-16):  AAA sac size: 4.6 cm  no endoleak detected    Medical Decision Making  Reginald [name] is a 77 y.o. male who presents s/p EVAR (Date:01-26-15).  Pt is asymptomatic with increase in sac size, no endoleak.  I discussed with the patient the importance of surveillance of the endograft.  I discussed with Dr. Bridgett Larsson the AAA sac size increase to 5 cm from 4 cm in a year.   The next CTA will be scheduled for 2-4 weeks, see Dr. Oneida Alar arterward.  I emphasized the importance of maximal medical management including strict control of blood pressure, blood glucose, and lipid levels, antiplatelet agents, obtaining regular exercise, and cessation of smoking.   Thank you for allowing [place] to participate in this patient's care.  Clemon Chambers, RN, MSN, FNP-C [facility] [place] Office: [phone]  Clinic Physician: Brabham/Chen  02/24/2018, 11:08  AM

## 2018-02-26 ENCOUNTER — Other Ambulatory Visit: Payer: Self-pay

## 2018-02-26 DIAGNOSIS — I714 Abdominal aortic aneurysm, without rupture, unspecified: Secondary | ICD-10-CM

## 2018-02-26 DIAGNOSIS — Z95828 Presence of other vascular implants and grafts: Secondary | ICD-10-CM

## 2018-03-05 ENCOUNTER — Other Ambulatory Visit (HOSPITAL_COMMUNITY): Payer: Medicare Other

## 2018-03-05 ENCOUNTER — Ambulatory Visit: Payer: Medicare Other | Admitting: Family

## 2018-03-06 ENCOUNTER — Other Ambulatory Visit (HOSPITAL_COMMUNITY): Payer: Medicare Other

## 2018-03-06 ENCOUNTER — Ambulatory Visit: Payer: Medicare Other | Admitting: Family

## 2018-03-10 ENCOUNTER — Ambulatory Visit
Admission: RE | Admit: 2018-03-10 | Discharge: 2018-03-10 | Disposition: A | Discharge status: Home / Self Care | Payer: Medicare Other | Source: Ambulatory Visit | Attending: Vascular Surgery | Admitting: Vascular Surgery

## 2018-03-10 DIAGNOSIS — Z95828 Presence of other vascular implants and grafts: Secondary | ICD-10-CM

## 2018-03-10 DIAGNOSIS — I714 Abdominal aortic aneurysm, without rupture, unspecified: Secondary | ICD-10-CM

## 2018-03-10 MED ORDER — IOPAMIDOL (ISOVUE-370) INJECTION 76%
75.0000 mL | Freq: Once | INTRAVENOUS | Status: AC | PRN
  Administered 2018-03-10: 75 mL via INTRAVENOUS

## 2018-03-14 ENCOUNTER — Ambulatory Visit (INDEPENDENT_AMBULATORY_CARE_PROVIDER_SITE_OTHER): Payer: Medicare Other | Admitting: Vascular Surgery

## 2018-03-14 ENCOUNTER — Other Ambulatory Visit: Payer: Self-pay

## 2018-03-14 ENCOUNTER — Encounter: Payer: Self-pay | Admitting: Vascular Surgery

## 2018-03-14 VITALS — BP 139/79 | HR 80 | Resp 20 | Ht 70.0 in | Wt 219.3 lb

## 2018-03-14 DIAGNOSIS — I714 Abdominal aortic aneurysm, without rupture, unspecified: Secondary | ICD-10-CM

## 2018-03-14 NOTE — Progress Notes (Signed)
Patient is a 77 year old male who returns for follow-up today.  He previously underwent repair of an abdominal aortic aneurysm with a Gore Excluder stent graft in 2016.  He recently saw our nurse practitioner and was noted on ultrasound to have increase in size of the aneurysm to 5 cm in diameter from 4.6.  He returns today after a CT Angio for follow-up.  He has chronic back pain has had no change in this.  He denies abdominal pain.  He feels well.  Review of systems: He denies shortness of breath.  He denies chest pain.  Physical exam:  Vitals:   03/14/18 0901 03/14/18 0903  BP: (!) 144/82 139/79  Pulse: 80   Resp: 20   SpO2: 95%   Weight: 219 lb 4.8 oz (99.5 kg)   Height: 5\' 10"  (1.778 m)     Abdomen: Soft nontender nondistended no pulsatile mass 2+ femoral pulses neck no carotid bruits chest clear to auscultation bilaterally  Data: CT Angio of the abdomen and pelvis is reviewed from March 10, 2018.  This shows no evidence of endoleak.  Aortic diameter is 4.6 cm which is consistent with all of his CT scans dating back to postop from 2016.  Assessment: Stable infrarenal abdominal aortic repair with Gore Excluder stent graft aortic diameter stable at 4.6 cm since 2016 on repeat CT Angio.  Plan: The patient will have a follow-up aortic ultrasound in 1 year.  We will see our nurse practitioner at that visit.  Ruta Hinds, MD [facility] [place] Office: [phone] Pager: [phone]

## 2018-03-27 ENCOUNTER — Ambulatory Visit: Payer: Medicare Other | Admitting: Vascular Surgery

## 2018-04-29 ENCOUNTER — Inpatient Hospital Stay (HOSPITAL_COMMUNITY)
Admission: EM | Admit: 2018-04-29 | Discharge: 2018-05-15 | DRG: 180 | Disposition: A | Discharge status: Hospice | Payer: Medicare Other | Attending: Oncology | Admitting: Oncology

## 2018-04-29 ENCOUNTER — Emergency Department (HOSPITAL_COMMUNITY): Payer: Medicare Other

## 2018-04-29 ENCOUNTER — Encounter (HOSPITAL_COMMUNITY): Payer: Self-pay | Admitting: Emergency Medicine

## 2018-04-29 ENCOUNTER — Other Ambulatory Visit: Payer: Self-pay

## 2018-04-29 DIAGNOSIS — Z9889 Other specified postprocedural states: Secondary | ICD-10-CM

## 2018-04-29 DIAGNOSIS — C3411 Malignant neoplasm of upper lobe, right bronchus or lung: Secondary | ICD-10-CM | POA: Diagnosis not present

## 2018-04-29 DIAGNOSIS — R918 Other nonspecific abnormal finding of lung field: Secondary | ICD-10-CM

## 2018-04-29 DIAGNOSIS — K219 Gastro-esophageal reflux disease without esophagitis: Secondary | ICD-10-CM | POA: Diagnosis present

## 2018-04-29 DIAGNOSIS — R42 Dizziness and giddiness: Secondary | ICD-10-CM

## 2018-04-29 DIAGNOSIS — N400 Enlarged prostate without lower urinary tract symptoms: Secondary | ICD-10-CM | POA: Diagnosis present

## 2018-04-29 DIAGNOSIS — E860 Dehydration: Secondary | ICD-10-CM | POA: Diagnosis present

## 2018-04-29 DIAGNOSIS — Z515 Encounter for palliative care: Secondary | ICD-10-CM | POA: Diagnosis not present

## 2018-04-29 DIAGNOSIS — K5900 Constipation, unspecified: Secondary | ICD-10-CM | POA: Diagnosis not present

## 2018-04-29 DIAGNOSIS — R112 Nausea with vomiting, unspecified: Secondary | ICD-10-CM | POA: Diagnosis present

## 2018-04-29 DIAGNOSIS — M1990 Unspecified osteoarthritis, unspecified site: Secondary | ICD-10-CM | POA: Diagnosis present

## 2018-04-29 DIAGNOSIS — Z8249 Family history of ischemic heart disease and other diseases of the circulatory system: Secondary | ICD-10-CM

## 2018-04-29 DIAGNOSIS — C3490 Malignant neoplasm of unspecified part of unspecified bronchus or lung: Secondary | ICD-10-CM

## 2018-04-29 DIAGNOSIS — G130 Paraneoplastic neuromyopathy and neuropathy: Secondary | ICD-10-CM

## 2018-04-29 DIAGNOSIS — I480 Paroxysmal atrial fibrillation: Secondary | ICD-10-CM | POA: Diagnosis not present

## 2018-04-29 DIAGNOSIS — R0603 Acute respiratory distress: Secondary | ICD-10-CM | POA: Diagnosis present

## 2018-04-29 DIAGNOSIS — N390 Urinary tract infection, site not specified: Secondary | ICD-10-CM | POA: Diagnosis present

## 2018-04-29 DIAGNOSIS — Z86718 Personal history of other venous thrombosis and embolism: Secondary | ICD-10-CM

## 2018-04-29 DIAGNOSIS — H5509 Other forms of nystagmus: Secondary | ICD-10-CM | POA: Diagnosis present

## 2018-04-29 DIAGNOSIS — R059 Cough, unspecified: Secondary | ICD-10-CM

## 2018-04-29 DIAGNOSIS — Z841 Family history of disorders of kidney and ureter: Secondary | ICD-10-CM

## 2018-04-29 DIAGNOSIS — I680 Cerebral amyloid angiopathy: Secondary | ICD-10-CM | POA: Diagnosis present

## 2018-04-29 DIAGNOSIS — E785 Hyperlipidemia, unspecified: Secondary | ICD-10-CM | POA: Diagnosis present

## 2018-04-29 DIAGNOSIS — Z7951 Long term (current) use of inhaled steroids: Secondary | ICD-10-CM

## 2018-04-29 DIAGNOSIS — E854 Organ-limited amyloidosis: Secondary | ICD-10-CM | POA: Diagnosis present

## 2018-04-29 DIAGNOSIS — Z87891 Personal history of nicotine dependence: Secondary | ICD-10-CM

## 2018-04-29 DIAGNOSIS — H8190 Unspecified disorder of vestibular function, unspecified ear: Secondary | ICD-10-CM | POA: Diagnosis present

## 2018-04-29 DIAGNOSIS — R0989 Other specified symptoms and signs involving the circulatory and respiratory systems: Secondary | ICD-10-CM

## 2018-04-29 DIAGNOSIS — Z8349 Family history of other endocrine, nutritional and metabolic diseases: Secondary | ICD-10-CM

## 2018-04-29 DIAGNOSIS — I4891 Unspecified atrial fibrillation: Secondary | ICD-10-CM | POA: Diagnosis not present

## 2018-04-29 DIAGNOSIS — J690 Pneumonitis due to inhalation of food and vomit: Secondary | ICD-10-CM | POA: Diagnosis not present

## 2018-04-29 DIAGNOSIS — J431 Panlobular emphysema: Secondary | ICD-10-CM

## 2018-04-29 DIAGNOSIS — H81399 Other peripheral vertigo, unspecified ear: Secondary | ICD-10-CM | POA: Diagnosis present

## 2018-04-29 DIAGNOSIS — R05 Cough: Secondary | ICD-10-CM

## 2018-04-29 DIAGNOSIS — Z7982 Long term (current) use of aspirin: Secondary | ICD-10-CM

## 2018-04-29 DIAGNOSIS — H5500 Unspecified nystagmus: Secondary | ICD-10-CM

## 2018-04-29 DIAGNOSIS — R1310 Dysphagia, unspecified: Secondary | ICD-10-CM | POA: Diagnosis not present

## 2018-04-29 DIAGNOSIS — R270 Ataxia, unspecified: Secondary | ICD-10-CM | POA: Diagnosis not present

## 2018-04-29 DIAGNOSIS — C342 Malignant neoplasm of middle lobe, bronchus or lung: Secondary | ICD-10-CM

## 2018-04-29 HISTORY — DX: Dizziness and giddiness: R42

## 2018-04-29 LAB — COMPREHENSIVE METABOLIC PANEL
ALK PHOS: 57 U/L (ref 38–126)
ALT: 14 U/L — ABNORMAL LOW (ref 17–63)
ANION GAP: 11 (ref 5–15)
AST: 18 U/L (ref 15–41)
Albumin: 3.8 g/dL (ref 3.5–5.0)
BILIRUBIN TOTAL: 1.9 mg/dL — AB (ref 0.3–1.2)
BUN: 19 mg/dL (ref 6–20)
CALCIUM: 9.5 mg/dL (ref 8.9–10.3)
CO2: 25 mmol/L (ref 22–32)
Chloride: 103 mmol/L (ref 101–111)
Creatinine, Ser: 1.23 mg/dL (ref 0.61–1.24)
GFR calc non Af Amer: 55 mL/min — ABNORMAL LOW (ref >60)
Glucose, Bld: 140 mg/dL — ABNORMAL HIGH (ref 65–99)
POTASSIUM: 3.9 mmol/L (ref 3.5–5.1)
SODIUM: 139 mmol/L (ref 135–145)
Total Protein: 7.4 g/dL (ref 6.5–8.1)

## 2018-04-29 LAB — CBC
HCT: 42.8 % (ref 39.0–52.0)
HEMOGLOBIN: 15 g/dL (ref 13.0–17.0)
MCH: 33.4 pg (ref 26.0–34.0)
MCHC: 35 g/dL (ref 30.0–36.0)
MCV: 95.3 fL (ref 78.0–100.0)
Platelets: 207 10*3/uL (ref 150–400)
RBC: 4.49 MIL/uL (ref 4.22–5.81)
RDW: 12.5 % (ref 11.5–15.5)
WBC: 7.8 10*3/uL (ref 4.0–10.5)

## 2018-04-29 LAB — URINALYSIS, ROUTINE W REFLEX MICROSCOPIC
Bilirubin Urine: NEGATIVE
GLUCOSE, UA: NEGATIVE mg/dL
KETONES UR: 5 mg/dL — AB
Nitrite: NEGATIVE
PH: 6 (ref 5.0–8.0)
Protein, ur: 30 mg/dL — AB
Specific Gravity, Urine: 1.021 (ref 1.005–1.030)

## 2018-04-29 LAB — LIPASE, BLOOD: Lipase: 36 U/L (ref 11–51)

## 2018-04-29 LAB — PROTIME-INR
INR: 1.1
Prothrombin Time: 14.1 seconds (ref 11.4–15.2)

## 2018-04-29 LAB — ETHANOL: Alcohol, Ethyl (B): <10 mg/dL (ref <10)

## 2018-04-29 LAB — I-STAT TROPONIN, ED: Troponin i, poc: 0 ng/mL (ref 0.00–0.08)

## 2018-04-29 LAB — APTT: aPTT: 24 seconds (ref 24–36)

## 2018-04-29 MED ORDER — ACETAMINOPHEN 325 MG PO TABS: 650.0000 mg | ORAL_TABLET | Freq: Four times a day (QID) | ORAL | Status: DC | PRN

## 2018-04-29 MED ORDER — MECLIZINE HCL 25 MG PO TABS
25.0000 mg | ORAL_TABLET | Freq: Once | ORAL | Status: AC
  Administered 2018-04-29: 25 mg via ORAL
  Filled 2018-04-29: qty 1

## 2018-04-29 MED ORDER — SODIUM CHLORIDE 0.9 % IV BOLUS
1000.0000 mL | Freq: Once | INTRAVENOUS | Status: AC
  Administered 2018-04-29: 1000 mL via INTRAVENOUS

## 2018-04-29 MED ORDER — ACETAMINOPHEN 650 MG RE SUPP: 650.0000 mg | Freq: Four times a day (QID) | RECTAL | Status: DC | PRN

## 2018-04-29 MED ORDER — ENOXAPARIN SODIUM 40 MG/0.4ML ~~LOC~~ SOLN
40.0000 mg | SUBCUTANEOUS | Status: DC
  Administered 2018-04-30 – 2018-05-02 (×3): 40 mg via SUBCUTANEOUS
  Filled 2018-04-29 (×3): qty 0.4

## 2018-04-29 MED ORDER — PANTOPRAZOLE SODIUM 40 MG PO TBEC
40.0000 mg | DELAYED_RELEASE_TABLET | Freq: Once | ORAL | Status: AC
  Administered 2018-04-29: 40 mg via ORAL
  Filled 2018-04-29: qty 1

## 2018-04-29 MED ORDER — FLUTICASONE PROPIONATE HFA 44 MCG/ACT IN AERO: 2.0000 | INHALATION_SPRAY | RESPIRATORY_TRACT | Status: DC

## 2018-04-29 MED ORDER — LORAZEPAM 2 MG/ML IJ SOLN
0.5000 mg | Freq: Once | INTRAMUSCULAR | Status: AC
  Administered 2018-04-29: 0.5 mg via INTRAVENOUS
  Filled 2018-04-29: qty 1

## 2018-04-29 MED ORDER — DOXAZOSIN MESYLATE 4 MG PO TABS
4.0000 mg | ORAL_TABLET | Freq: Every day | ORAL | Status: DC
  Administered 2018-04-29 – 2018-05-14 (×16): 4 mg via ORAL
  Filled 2018-04-29 (×5): qty 1
  Filled 2018-04-29: qty 2
  Filled 2018-04-29 (×12): qty 1

## 2018-04-29 MED ORDER — SODIUM CHLORIDE 0.9 % IV SOLN
INTRAVENOUS | Status: DC
  Administered 2018-04-29 – 2018-04-30 (×2): via INTRAVENOUS

## 2018-04-29 MED ORDER — FINASTERIDE 5 MG PO TABS
5.0000 mg | ORAL_TABLET | Freq: Every day | ORAL | Status: DC
  Administered 2018-04-29 – 2018-05-14 (×16): 5 mg via ORAL
  Filled 2018-04-29 (×16): qty 1

## 2018-04-29 MED ORDER — ONDANSETRON 4 MG PO TBDP
4.0000 mg | ORAL_TABLET | Freq: Once | ORAL | Status: AC | PRN
  Administered 2018-04-29: 4 mg via ORAL
  Filled 2018-04-29: qty 1

## 2018-04-29 NOTE — ED Notes (Signed)
Pt in room and resting with family @ bedside VS documented Call light within reach

## 2018-04-29 NOTE — ED Notes (Signed)
Patient transported to MRI 

## 2018-04-29 NOTE — ED Notes (Signed)
Patient transported to CT 

## 2018-04-29 NOTE — ED Provider Notes (Signed)
[facility] EMERGENCY DEPARTMENT Provider Note   CSN: [id number] Arrival date & time: 04/29/18  7616     History   Chief Complaint Chief Complaint  Patient presents with  . Nausea  . Emesis  . Fatigue    HPI Reginald [name] is a 77 y.o. male with a past medical history of AAA, COPD, DVT who presents emergency department for vertigo, nausea and vomiting.  Patient states he had onset of the symptoms 4 days ago.  He has had persistent nausea with turning of his head in either direction and has associated ataxia.  He states he has been unable to hold down any foods or fluids.  He is never had anything like this before.  He denies any other neurologic complaints such as headache, unilateral vision loss, weakness, difficulty with speech or swallowing.  The patient denies abdominal pain.  He is currently on Cipro for urinary tract infection.  HPI  Past Medical History:  Diagnosis Date  . AAA (abdominal aortic aneurysm) ([place])   . Arthritis   . Asthma   . COPD (chronic obstructive pulmonary disease) ([place])   . DVT (deep venous thrombosis) ([place]) 2005   Right  leg  . Headache    cluster headaches  . Hyperlipidemia   . Joint pain   . Shortness of breath dyspnea    exertion    Patient Active Problem List   Diagnosis Date Noted  . AAA (abdominal aortic aneurysm) ([place]) 01/26/2015  . Preop cardiovascular exam 01/10/2015  . Hyperlipidemia 01/10/2015  . Abdominal aortic aneurysm ([place]) 11/29/2011    Past Surgical History:  Procedure Laterality Date  . ABDOMINAL AORTIC ENDOVASCULAR STENT GRAFT N/A 01/26/2015   Procedure: ABDOMINAL AORTIC ENDOVASCULAR STENT GRAFT;  Surgeon: Elam Dutch, MD;  Location: [facility];  Service: Vascular;  Laterality: N/A;  . FRACTURE SURGERY  2005   Right heel - Fx and NO SURGERY        Home Medications    Prior to Admission medications   Medication Sig Start Date End Date Taking? Authorizing Provider  albuterol (PROVENTIL  HFA;VENTOLIN HFA) 108 (90 BASE) MCG/ACT inhaler Inhale 2 puffs into the lungs every 4 (four) hours as needed for shortness of breath.    Yes [provider]  aspirin 325 MG tablet Take 325 mg by mouth at bedtime.    Yes [provider]  ciprofloxacin (CIPRO) 500 MG tablet Take 500 mg by mouth 2 (two) times daily. 04/17/18  Yes [provider]  clobetasol cream (TEMOVATE) 0.73 % Apply 1 application topically 2 (two) times daily as needed (for dry skin).  11/23/12  Yes [provider]  doxazosin (CARDURA) 4 MG tablet Take 4 mg by mouth at bedtime.    Yes [provider]  finasteride (PROSCAR) 5 MG tablet Take 5 mg by mouth at bedtime.  10/20/12  Yes [provider]  fluticasone (FLONASE) 50 MCG/ACT nasal spray Place 2 sprays into both nostrils daily.    Yes [provider]  fluticasone (FLOVENT HFA) 44 MCG/ACT inhaler Inhale 2 puffs into the lungs every other day.    Yes [provider]  methocarbamol (ROBAXIN) 500 MG tablet Take 500 mg by mouth as needed for muscle spasms (Muscle spasms).    Yes [provider]  ondansetron (ZOFRAN) 4 MG tablet Take 4 mg by mouth daily as needed. 04/28/18  Yes [provider]  topiramate (TOPAMAX) 50 MG tablet Take 50 mg by mouth as needed.  Yes [provider]  triamcinolone ointment (KENALOG) 0.1 % Apply 1 application topically as needed (for dry skin).  12/22/14  Yes [provider]    Family History Family History  Problem Relation Age of Onset  . Hyperlipidemia Mother   . Hypertension Mother   . Cancer Father        Bladder  . Hyperlipidemia Father   . Hypertension Father   . Heart disease Brother        Heart Disease before age 77  . Heart attack Brother        x's 2  . Hyperlipidemia Brother   . Hypertension Brother   . Hyperlipidemia Sister   . Hypertension Sister     Social History Social History   Tobacco Use  . Smoking status:  Former Smoker    Types: E-cigarettes    Last attempt to quit: 03/05/2012    Years since quitting: 6.1  . Smokeless tobacco: Never Used  . Tobacco comment: Pt smokes the Electronic Cigarettes  Substance Use Topics  . Alcohol use: No    Alcohol/week: 0.0 oz  . Drug use: No     Allergies   Patient has no known allergies.   Review of Systems Review of Systems Ten systems reviewed and are negative for acute change, except as noted in the HPI.    Physical Exam Updated Vital Signs BP (!) 154/77 (BP Location: Right Arm)   Pulse 60   Temp 98.3 F (36.8 C) (Oral)   Resp 16   Ht 5\' 10"  (1.778 m)   Wt 98.9 kg (218 lb)   SpO2 95%   BMI 31.28 kg/m   Physical Exam  Constitutional: He is oriented to person, place, and time. He appears well-developed and well-nourished. No distress.  HENT:  Head: Normocephalic and atraumatic.  Eyes: Pupils are equal, round, and reactive to light. Conjunctivae and EOM are normal. No scleral icterus.  Neck: Normal range of motion. Neck supple.  Cardiovascular: Normal rate, regular rhythm and normal heart sounds.  Pulmonary/Chest: Effort normal and breath sounds normal. No respiratory distress.  Abdominal: Soft. There is no tenderness.  Musculoskeletal: He exhibits no edema.  Neurological: He is alert and oriented to person, place, and time. He displays normal reflexes. No cranial nerve deficit or sensory deficit. He exhibits normal muscle tone. Coordination normal.  No nystagmus No drift Normal F-n  Skin: Skin is warm and dry. He is not diaphoretic.  Psychiatric: His behavior is normal.  Nursing note and vitals reviewed.    ED Treatments / Results  Labs (all labs ordered are listed, but only abnormal results are displayed) Labs Reviewed  COMPREHENSIVE METABOLIC PANEL - Abnormal; Notable for the following components:      Result Value   Glucose, Bld 140 (*)    ALT 14 (*)    Total Bilirubin 1.9 (*)    GFR calc non Af Amer 55 (*)    All other  components within normal limits  LIPASE, BLOOD  CBC  URINALYSIS, ROUTINE W REFLEX MICROSCOPIC    EKG EKG Interpretation  Date/Time:  Tuesday Apr 29 2018 17:24:41 EDT Ventricular Rate:  57 PR Interval:    QRS Duration: 102 QT Interval:  441 QTC Calculation: 430 R Axis:   53 Text Interpretation:  Sinus rhythm Confirmed by Dene Gentry [phone]) on 04/29/2018 5:40:15 PM   Radiology No results found.  Procedures Procedures (including critical care time)  Medications Ordered in ED Medications  meclizine (ANTIVERT) tablet 25 mg (  has no administration in time range)  pantoprazole (PROTONIX) EC tablet 40 mg (has no administration in time range)  sodium chloride 0.9 % bolus 1,000 mL (has no administration in time range)  LORazepam (ATIVAN) injection 0.5 mg (has no administration in time range)  ondansetron (ZOFRAN-ODT) disintegrating tablet 4 mg (4 mg Oral Given 04/29/18 1050)     Initial Impression / Assessment and Plan / ED Course  I have reviewed the triage vital signs and the nursing notes.  Pertinent labs & imaging results that were available during my care of the patient were reviewed by me and considered in my medical decision making (see chart for details).  Clinical Course as of Apr 29 1936  Tue Apr 29, 2018  1934 Patient n/v improved. He feels that his sxs are decreased, however he is still very symptomatic with head movement. I sat the patient up and he became acutely dizzy. He had mild nausea.   [AH]    Clinical Course User Index [AH] Margarita Mail, PA-C    Patient will be admitted to the internal medicine service for persistent vertiginous symptoms.  His MRI is pending.  Patient's urine culture reviewed from [facility] and grew out no bacteria.  His urine does not appear infected he is currently still on Cipro.  Otherwise labs appear within normal limits.  His EKG does not show any significant or abnormalities at this time.  Final  Clinical Impressions(s) / ED Diagnoses   Final diagnoses:  Vertigo  Nausea and vomiting, intractability of vomiting not specified, unspecified vomiting type    ED Discharge Orders    None       Margarita Mail, PA-C 04/29/18 2125    Valarie Merino, MD 04/29/18 2252

## 2018-04-29 NOTE — ED Triage Notes (Signed)
Onset 3 days ago developed nausea, emesis, and fatigue. Doctor called in [place] for patient helps with slight relief. Patient alert answering and following commands appropriate.

## 2018-04-29 NOTE — ED Notes (Signed)
Pt reports he was seen last Monday for UTI and prescribed Cipro. Pt has been taking Cipro since last Monday and urinary symptoms have not resolved.  Emesis and nausea for three days

## 2018-04-29 NOTE — H&P (Signed)
Date: 04/29/2018               Patient Name:  Reginald [name] MRN: [id number]  DOB: 1941-05-05 Age / Sex: 77 y.o., male   PCP: Christain Sacramento, MD         Medical Service: Internal Medicine Teaching Service         Attending Physician: Dr. Rebeca Alert Raynaldo Opitz, MD    First Contact: Dr. Berneice Gandy Pager: [phone]  Second Contact: Dr. Reesa Chew Pager: [phone]       After Hours (After 5p/  First Contact Pager: [phone]  weekends / holidays): Second Contact Pager: [phone]   Chief Complaint: Nausea / Vomitting  History of Present Illness: Reginald [name] is a 73 to M with Hx of AAA (s/p stent in 2016), COPD, Asthma, DVT (2005), and BPH who presented with nausea and vomiting. History provided by patient and his son (who was at bedside) Patient reports 4 days of nausea and vomiting. The symptoms have been constant and progressive over the past 4 days. He experiences nausea and vomiting with position changes and has had some associated ataxia that has worsened of the past 4 days (son state this appears to have worsened as he has become weaker). Patient has only been able to keep down sips of water and some chicken noodle soup since symptoms began. On the second day he started taking Zofran, which was called in by his PCP, but this provided only minimal relief. He presented today on the advise of his PCP for fluid and further workup considering his lack of response to Zofran. Patient endorse ongoing dysuria, recent hematuria, and intermittent dizziness (but denies room spinning sensation). He denies Fevers, chills, chest pain, shortness of breath, headache, vision changes, hearing changes, or changes to his bowl habits.  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 - Interventions: Zofran, Meclizine, Protonix, 1L IVF, 0.5mg  Ativan  Patient to be admitted for further workup and care.  Meds:  Current Meds  Medication Sig  . albuterol (PROVENTIL HFA;VENTOLIN HFA) 108 (90 BASE) MCG/ACT inhaler Inhale 2 puffs into the lungs every 4 (four) hours as needed for shortness of breath.   Marland Kitchen aspirin 325 MG tablet Take 325 mg by mouth at bedtime.   . ciprofloxacin (CIPRO) 500 MG tablet Take 500 mg by mouth 2 (two) times daily.  . clobetasol cream (TEMOVATE) 4.26 % Apply 1 application topically 2 (two) times daily as needed (for dry skin).   Marland Kitchen doxazosin (CARDURA) 4 MG tablet Take 4 mg by mouth at bedtime.   . finasteride (PROSCAR) 5 MG tablet Take 5 mg by mouth at bedtime.   . fluticasone (FLONASE) 50 MCG/ACT nasal spray Place 2 sprays into both nostrils daily.   . fluticasone (FLOVENT HFA) 44 MCG/ACT inhaler Inhale 2 puffs into the lungs every other day.   . methocarbamol (ROBAXIN) 500 MG tablet Take 500 mg by mouth as needed for muscle spasms (Muscle spasms).   . ondansetron (ZOFRAN) 4 MG tablet Take 4 mg by mouth daily as needed.  . topiramate (TOPAMAX) 50 MG tablet Take 50 mg by mouth as needed.  . triamcinolone ointment (KENALOG) 0.1 % Apply 1 application topically as needed (  for dry skin).     Allergies: Allergies as of 04/29/2018  . (No Known Allergies)   Past Medical History:  Diagnosis Date  . AAA (abdominal aortic aneurysm) ([place])   . Arthritis   . Asthma   . COPD (chronic obstructive pulmonary disease) ([place])   . DVT (deep venous thrombosis) ([place]) 2005   Right  leg  . Headache    cluster headaches  . Hyperlipidemia   . Joint pain   . Shortness of breath dyspnea    exertion    Family History: Family History  Problem Relation Age of Onset  . Hyperlipidemia Mother   . Hypertension Mother   . Cancer Father        Bladder  . Hyperlipidemia Father   . Hypertension Father   . Kidney disease Father   . Heart  disease Brother        Heart Disease before age 84  . Heart attack Brother        x's 2  . Hyperlipidemia Brother   . Hypertension Brother   . Hyperlipidemia Sister   . Hypertension Sister   - Reviewed on admission  Social History: Social History   Tobacco Use  . Smoking status: Former Smoker    Packs/day: 1.00    Years: 30.00    Pack years: 30.00    Types: E-cigarettes, Cigarettes    Last attempt to quit: 03/05/2012    Years since quitting: 6.1  . Smokeless tobacco: Never Used  Substance Use Topics  . Alcohol use: No    Alcohol/week: 0.0 oz  . Drug use: No  - Reviewed on admission  Review of Systems: A complete ROS was negative except as per HPI.  Physical Exam: Blood pressure (!) 150/85, pulse (!) 56, temperature (!) 97.4 F (36.3 C), resp. rate (!) 21, height 5\' 10"  (1.778 m), weight 218 lb (98.9 kg), SpO2 92 %. Physical Exam  Constitutional: He appears well-developed and well-nourished.  Tired appearing, mildly distressed, but in good spirits  HENT:  Head: Normocephalic and atraumatic.  Mouth/Throat: Oropharynx is clear and moist.  Eyes: EOM are normal. Right eye exhibits no discharge.  Cardiovascular: Normal rate, regular rhythm, normal heart sounds and intact distal pulses.  Pulmonary/Chest: Effort normal and breath sounds normal. No respiratory distress.  Patient not repositioned to auscultate posterior breath sounds give ongoing vomiting with repositioning  Abdominal: Soft. Bowel sounds are normal. He exhibits no distension. There is no tenderness.  Palpable AAA  Musculoskeletal: He exhibits no edema or deformity.  Neurological: He is alert. No cranial nerve deficit or sensory deficit.  No nystagmus, normal test of skew  Skin: Skin is warm and dry.   EKG: personally reviewed my interpretation is NSR at 57 BPM.  CT Head: IMPRESSION: No acute finding by CT. Advanced chronic appearing small vessel ischemic changes of the cerebral hemispheric white matter,  somewhat progressive since 2013. 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  Reginald Brain: IMPRESSION: 1. No acute intracranial process. 2. 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 3. Severe chronic small vessel ischemic changes. Old small cerebellar infarcts.  Assessment & Plan by Problem: 89 to M with Hx of AAA (s/p stent in 2016), COPD, Asthma, DVT (2005), and BPH who presented with vertigo with nausea and vomiting.  Nausea and Vomiting: Patient presented with three days of room spinning sensation with associated nausea and vomiting. The symptoms are triggered by position changes including sitting up and rolling on his side  while in the ED. No nystagmus, Normal test of skew on exam. CT Head and Reginald Brain negative for acute changes (do show chronic micro vascular changes). Suspect BPPV giving patient positional symptoms and negative imaging studies. Treatment in ED included  Zofran, Meclizine, Protonix, IVF, and Ativan. Patient tired when seen following ativan administration.  - Cardiac Monitoring - Vestibular PT - NS 100cc/hr - AM CBC and BMP  Urinary Symptoms: Patient saw his PCP on 5/16 for Hematuria and dysuria without urinary frequency.  Had abnormal UA and was prescribed ciprofloxacin x10 days. Urine culture was obtained, which came back negative. He reports ongoing dysuria, but improvement in hematuria. Do not suspect UTI given negative culture.  COPD/Asthma: (No PFTs in EMR). On Fluticasone and Albuterol PRN at home. - Continue home Fluticasone Inhaler and Albuterol  BPH: - Continue Doxazosin and Finasteride daily  FEN: Clear liquid (advance as tolerated), NS 100cc/Hr VTE ppx: Lovenox Code Status: FULL  Dispo: Admit patient to Observation with expected length of stay less than 2 midnights.  Signed: Neva Seat, MD 04/29/2018, 10:05 PM  Pager: [phone]

## 2018-04-30 DIAGNOSIS — I680 Cerebral amyloid angiopathy: Secondary | ICD-10-CM | POA: Diagnosis present

## 2018-04-30 DIAGNOSIS — N401 Enlarged prostate with lower urinary tract symptoms: Secondary | ICD-10-CM

## 2018-04-30 DIAGNOSIS — Z87891 Personal history of nicotine dependence: Secondary | ICD-10-CM | POA: Diagnosis not present

## 2018-04-30 DIAGNOSIS — E785 Hyperlipidemia, unspecified: Secondary | ICD-10-CM | POA: Diagnosis present

## 2018-04-30 DIAGNOSIS — Z7982 Long term (current) use of aspirin: Secondary | ICD-10-CM | POA: Diagnosis not present

## 2018-04-30 DIAGNOSIS — N400 Enlarged prostate without lower urinary tract symptoms: Secondary | ICD-10-CM | POA: Diagnosis present

## 2018-04-30 DIAGNOSIS — R12 Heartburn: Secondary | ICD-10-CM | POA: Diagnosis not present

## 2018-04-30 DIAGNOSIS — R1013 Epigastric pain: Secondary | ICD-10-CM | POA: Diagnosis not present

## 2018-04-30 DIAGNOSIS — K210 Gastro-esophageal reflux disease with esophagitis: Secondary | ICD-10-CM | POA: Diagnosis not present

## 2018-04-30 DIAGNOSIS — E860 Dehydration: Secondary | ICD-10-CM | POA: Diagnosis present

## 2018-04-30 DIAGNOSIS — Z8249 Family history of ischemic heart disease and other diseases of the circulatory system: Secondary | ICD-10-CM | POA: Diagnosis not present

## 2018-04-30 DIAGNOSIS — J690 Pneumonitis due to inhalation of food and vomit: Secondary | ICD-10-CM | POA: Diagnosis not present

## 2018-04-30 DIAGNOSIS — Z95828 Presence of other vascular implants and grafts: Secondary | ICD-10-CM | POA: Diagnosis not present

## 2018-04-30 DIAGNOSIS — R0989 Other specified symptoms and signs involving the circulatory and respiratory systems: Secondary | ICD-10-CM | POA: Diagnosis not present

## 2018-04-30 DIAGNOSIS — N390 Urinary tract infection, site not specified: Secondary | ICD-10-CM | POA: Diagnosis present

## 2018-04-30 DIAGNOSIS — J449 Chronic obstructive pulmonary disease, unspecified: Secondary | ICD-10-CM | POA: Diagnosis not present

## 2018-04-30 DIAGNOSIS — I340 Nonrheumatic mitral (valve) insufficiency: Secondary | ICD-10-CM | POA: Diagnosis not present

## 2018-04-30 DIAGNOSIS — G119 Hereditary ataxia, unspecified: Secondary | ICD-10-CM | POA: Diagnosis not present

## 2018-04-30 DIAGNOSIS — Z7951 Long term (current) use of inhaled steroids: Secondary | ICD-10-CM | POA: Diagnosis not present

## 2018-04-30 DIAGNOSIS — H5500 Unspecified nystagmus: Secondary | ICD-10-CM | POA: Diagnosis not present

## 2018-04-30 DIAGNOSIS — Z7401 Bed confinement status: Secondary | ICD-10-CM | POA: Diagnosis not present

## 2018-04-30 DIAGNOSIS — R590 Localized enlarged lymph nodes: Secondary | ICD-10-CM | POA: Diagnosis not present

## 2018-04-30 DIAGNOSIS — R1310 Dysphagia, unspecified: Secondary | ICD-10-CM | POA: Diagnosis not present

## 2018-04-30 DIAGNOSIS — Z9889 Other specified postprocedural states: Secondary | ICD-10-CM | POA: Diagnosis not present

## 2018-04-30 DIAGNOSIS — K5900 Constipation, unspecified: Secondary | ICD-10-CM | POA: Diagnosis not present

## 2018-04-30 DIAGNOSIS — H5509 Other forms of nystagmus: Secondary | ICD-10-CM | POA: Diagnosis present

## 2018-04-30 DIAGNOSIS — R911 Solitary pulmonary nodule: Secondary | ICD-10-CM | POA: Diagnosis not present

## 2018-04-30 DIAGNOSIS — Z8349 Family history of other endocrine, nutritional and metabolic diseases: Secondary | ICD-10-CM | POA: Diagnosis not present

## 2018-04-30 DIAGNOSIS — Z86718 Personal history of other venous thrombosis and embolism: Secondary | ICD-10-CM | POA: Diagnosis not present

## 2018-04-30 DIAGNOSIS — Z83438 Family history of other disorder of lipoprotein metabolism and other lipidemia: Secondary | ICD-10-CM

## 2018-04-30 DIAGNOSIS — R270 Ataxia, unspecified: Secondary | ICD-10-CM | POA: Diagnosis not present

## 2018-04-30 DIAGNOSIS — I4891 Unspecified atrial fibrillation: Secondary | ICD-10-CM | POA: Diagnosis not present

## 2018-04-30 DIAGNOSIS — C3410 Malignant neoplasm of upper lobe, unspecified bronchus or lung: Secondary | ICD-10-CM | POA: Diagnosis not present

## 2018-04-30 DIAGNOSIS — R918 Other nonspecific abnormal finding of lung field: Secondary | ICD-10-CM | POA: Diagnosis not present

## 2018-04-30 DIAGNOSIS — H81399 Other peripheral vertigo, unspecified ear: Secondary | ICD-10-CM | POA: Diagnosis present

## 2018-04-30 DIAGNOSIS — R112 Nausea with vomiting, unspecified: Secondary | ICD-10-CM | POA: Diagnosis not present

## 2018-04-30 DIAGNOSIS — H8190 Unspecified disorder of vestibular function, unspecified ear: Secondary | ICD-10-CM | POA: Diagnosis present

## 2018-04-30 DIAGNOSIS — R42 Dizziness and giddiness: Secondary | ICD-10-CM | POA: Diagnosis present

## 2018-04-30 DIAGNOSIS — Z79899 Other long term (current) drug therapy: Secondary | ICD-10-CM

## 2018-04-30 DIAGNOSIS — I480 Paroxysmal atrial fibrillation: Secondary | ICD-10-CM | POA: Diagnosis not present

## 2018-04-30 DIAGNOSIS — R300 Dysuria: Secondary | ICD-10-CM | POA: Diagnosis not present

## 2018-04-30 DIAGNOSIS — K219 Gastro-esophageal reflux disease without esophagitis: Secondary | ICD-10-CM | POA: Diagnosis present

## 2018-04-30 DIAGNOSIS — M1990 Unspecified osteoarthritis, unspecified site: Secondary | ICD-10-CM | POA: Diagnosis present

## 2018-04-30 DIAGNOSIS — C3411 Malignant neoplasm of upper lobe, right bronchus or lung: Secondary | ICD-10-CM | POA: Diagnosis present

## 2018-04-30 DIAGNOSIS — R58 Hemorrhage, not elsewhere classified: Secondary | ICD-10-CM | POA: Diagnosis not present

## 2018-04-30 DIAGNOSIS — Z841 Family history of disorders of kidney and ureter: Secondary | ICD-10-CM | POA: Diagnosis not present

## 2018-04-30 DIAGNOSIS — E854 Organ-limited amyloidosis: Secondary | ICD-10-CM | POA: Diagnosis present

## 2018-04-30 DIAGNOSIS — Z95818 Presence of other cardiac implants and grafts: Secondary | ICD-10-CM | POA: Diagnosis not present

## 2018-04-30 DIAGNOSIS — I714 Abdominal aortic aneurysm, without rupture: Secondary | ICD-10-CM | POA: Diagnosis not present

## 2018-04-30 DIAGNOSIS — J431 Panlobular emphysema: Secondary | ICD-10-CM | POA: Diagnosis present

## 2018-04-30 DIAGNOSIS — Z7901 Long term (current) use of anticoagulants: Secondary | ICD-10-CM | POA: Diagnosis not present

## 2018-04-30 LAB — BASIC METABOLIC PANEL
Anion gap: 12 (ref 5–15)
BUN: 24 mg/dL — ABNORMAL HIGH (ref 6–20)
CO2: 26 mmol/L (ref 22–32)
Calcium: 9.3 mg/dL (ref 8.9–10.3)
Chloride: 104 mmol/L (ref 101–111)
Creatinine, Ser: 1.14 mg/dL (ref 0.61–1.24)
GFR calc Af Amer: >60 mL/min (ref >60)
GFR calc non Af Amer: >60 mL/min (ref >60)
Glucose, Bld: 109 mg/dL — ABNORMAL HIGH (ref 65–99)
Potassium: 3.8 mmol/L (ref 3.5–5.1)
Sodium: 142 mmol/L (ref 135–145)

## 2018-04-30 LAB — CBC
HCT: 41.4 % (ref 39.0–52.0)
Hemoglobin: 14.5 g/dL (ref 13.0–17.0)
MCH: 34.1 pg — ABNORMAL HIGH (ref 26.0–34.0)
MCHC: 35 g/dL (ref 30.0–36.0)
MCV: 97.4 fL (ref 78.0–100.0)
Platelets: 216 10*3/uL (ref 150–400)
RBC: 4.25 MIL/uL (ref 4.22–5.81)
RDW: 12.7 % (ref 11.5–15.5)
WBC: 9.5 10*3/uL (ref 4.0–10.5)

## 2018-04-30 MED ORDER — FAMOTIDINE 20 MG PO TABS
20.0000 mg | ORAL_TABLET | Freq: Two times a day (BID) | ORAL | Status: DC
  Administered 2018-04-30: 20 mg via ORAL
  Filled 2018-04-30: qty 1

## 2018-04-30 MED ORDER — FAMOTIDINE IN NACL 20-0.9 MG/50ML-% IV SOLN
20.0000 mg | Freq: Once | INTRAVENOUS | Status: AC
  Administered 2018-04-30: 20 mg via INTRAVENOUS
  Filled 2018-04-30: qty 50

## 2018-04-30 MED ORDER — MECLIZINE HCL 25 MG PO TABS
25.0000 mg | ORAL_TABLET | Freq: Two times a day (BID) | ORAL | Status: DC
  Administered 2018-04-30 (×2): 25 mg via ORAL
  Filled 2018-04-30 (×2): qty 1

## 2018-04-30 MED ORDER — PROMETHAZINE HCL 25 MG/ML IJ SOLN
12.5000 mg | Freq: Four times a day (QID) | INTRAMUSCULAR | Status: DC | PRN
  Administered 2018-05-01 (×2): 12.5 mg via INTRAVENOUS
  Filled 2018-04-30 (×2): qty 1

## 2018-04-30 MED ORDER — PROMETHAZINE HCL 25 MG PO TABS: 12.5000 mg | ORAL_TABLET | Freq: Four times a day (QID) | ORAL | Status: DC | PRN

## 2018-04-30 MED ORDER — ONDANSETRON HCL 4 MG/2ML IJ SOLN
4.0000 mg | Freq: Three times a day (TID) | INTRAMUSCULAR | Status: DC | PRN
  Administered 2018-04-30: 4 mg via INTRAVENOUS
  Filled 2018-04-30: qty 2

## 2018-04-30 MED ORDER — FAMOTIDINE 20 MG PO TABS: 20.0000 mg | ORAL_TABLET | Freq: Two times a day (BID) | ORAL | Status: DC

## 2018-04-30 MED ORDER — BUDESONIDE 0.25 MG/2ML IN SUSP
0.2500 mg | Freq: Every day | RESPIRATORY_TRACT | Status: DC
  Administered 2018-04-30 – 2018-05-14 (×15): 0.25 mg via RESPIRATORY_TRACT
  Filled 2018-04-30 (×17): qty 2

## 2018-04-30 NOTE — Evaluation (Signed)
Physical Therapy Evaluation Patient Details Name: Reginald [name] MRN: [id number] DOB: October 19, 1941 Today's Date: 04/30/2018   History of Present Illness  Mr Mau is a 77 to M with Hx of AAA (s/p stent in 2016), COPD, Asthma, DVT (2005), and BPH who presented with nausea and vomiting. History provided by patient and his son (who was at bedside) Patient reports 4 days of nausea and vomiting. The symptoms have been constant and progressive over the past 4 days. He experiences nausea and vomiting with position changes and has had some associated ataxia that has worsened of the past 4 days (son state this appears to have worsened as he has become weaker). Patient has only been able to keep down sips of water and some chicken noodle soup since symptoms began. On the second day he started taking Zofran, which was called in by his PCP, but this provided only minimal relief.   Clinical Impression  Pt admitted with above diagnosis. Pt currently with functional limitations due to the deficits listed below (see PT Problem List). Pt was able to tolerate Epley maneuver for right BPPV.  Pt felt better regarding dizziness after treatment.  Was nauseated quite a bit and did not want to walk. Will follow acutely.   Pt will benefit from skilled PT to increase their independence and safety with mobility to allow discharge to the venue listed below.      Follow Up Recommendations Home health PT;Supervision/Assistance - 24 hour(Pt wife just enrolled in  [facility] program)    Equipment Recommendations  Rolling walker with 5" wheels;3in1 (PT)    Recommendations for Other Services       Precautions / Restrictions Precautions Precautions: Fall Restrictions Weight Bearing Restrictions: No      Mobility  Bed Mobility Overal bed mobility: Needs Assistance Bed Mobility: Rolling;Sidelying to Sit;Sit to Sidelying Rolling: Mod assist;+2 for physical assistance Sidelying to sit: Max assist;+2 for physical assistance      Sit to sidelying: Mod assist;+2 for physical assistance General bed mobility comments: Difficult to get pt to roll and come to EOB due to treating vertigo.  Pt was nauseated and dizzy.   Transfers                 General transfer comment: NT as pt feeling nauseated after the Epley maneuver  Ambulation/Gait                Stairs            Wheelchair Mobility    Modified Rankin (Stroke Patients Only)       Balance Overall balance assessment: Needs assistance Sitting-balance support: Bilateral upper extremity supported;Feet supported Sitting balance-Leahy Scale: Poor Sitting balance - Comments: relies on UEs for balance in sitting initially after the Epley but progressed to being able to sit without support.        Standing balance comment: unable to stand due to nausea                             Pertinent Vitals/Pain Pain Assessment: No/denies pain  87-90% on RA therefore replaced 2L with sats >90%, HR 44-56 bpm with nurse and MD aware.  BP stable.   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 helping her    Prior Function Level of Independence: Independent               Hand Dominance        Extremity/Trunk Assessment   Upper Extremity Assessment Upper Extremity Assessment: Defer to OT evaluation    Lower Extremity Assessment Lower Extremity Assessment: Generalized weakness    Cervical / Trunk  Assessment Cervical / Trunk Assessment: Kyphotic  Communication   Communication: No difficulties  Cognition Arousal/Alertness: Awake/alert Behavior During Therapy: Flat affect Overall Cognitive Status: Within Functional Limits for tasks assessed                                        General Comments General comments (skin integrity, edema, etc.): Pt positive for right BPPV therefore treated with canalith repositioning maneuver for right BPPV.  Pt was nauseated post treatment but did not report any dizziness after treatment and was dizzy prior to treatment.     Exercises Other Exercises Other Exercises: instructed and gave handout for Nestor Lewandowsky exercises.  Also gave a handout about BPPV   Assessment/Plan    PT Assessment Patient needs continued PT services  PT Problem List Decreased activity tolerance;Decreased balance;Decreased mobility;Decreased knowledge of use of DME;Decreased safety awareness;Decreased knowledge of precautions(BPPV, dizziness)       PT Treatment Interventions DME instruction;Gait training;Functional mobility training;Therapeutic activities;Therapeutic exercise;Balance training;Patient/family education;Stair training(Epley maneuver, Exercises)    PT Goals (Current goals can be found in the Care Plan section)  Acute Rehab PT Goals Patient Stated Goal: to go home PT Goal Formulation: With patient Time For Goal Achievement: 05/14/18 Potential to Achieve Goals: Good    Frequency Min 3X/week   Barriers to discharge        Co-evaluation               AM-PAC PT "6 Clicks" Daily Activity  Outcome Measure Difficulty turning over in bed (including adjusting bedclothes, sheets and blankets)?: A Lot Difficulty moving from lying on back to sitting on the side of the bed? : A Lot Difficulty sitting down on and standing up from a chair with arms (e.g., wheelchair, bedside commode, etc,.)?: Unable Help needed moving to and from a bed to  chair (including a wheelchair)?: Total Help needed walking in hospital room?: Total Help needed climbing 3-5 steps with a railing? : Total 6 Click Score: 8    End of Session Equipment Utilized During Treatment: Gait belt Activity Tolerance: Patient limited by fatigue(limited by nausea) Patient left: in bed;with call bell/phone within reach;with family/visitor present Nurse Communication: Mobility status PT Visit Diagnosis: Unsteadiness on feet (R26.81);Muscle weakness (generalized) (M62.81);BPPV;Dizziness and giddiness (R42) BPPV - Right/Left : Right    Time: 2694-8546 PT Time Calculation (min) (ACUTE ONLY): 42 min   Charges:   PT Evaluation $PT Eval Moderate Complexity: 1 Mod PT Treatments $Therapeutic Exercise: 8-22 mins $Canalith Rep Proc: 8-22 mins   PT G Codes:        Dyann Goodspeed,PT Acute Rehabilitation [phone] (pager)   Denice Paradise 04/30/2018, 10:42 AM

## 2018-04-30 NOTE — Progress Notes (Signed)
   Subjective: Patient seen sitting comfortably in bed this AM. Patient states that symptoms of nausea, vomiting, and dizziness have greatly improved since meeting and working with the vestibular physical therapy group this AM. Patient states that no dizziness at rest and only experiences short bout of dizziness with fast movement of head from side to side. Patient also states he has not thrown up in about 30 minutes or so. Patient endorses feeling hungry and wants to try to eat.  Objective:  Vital signs in last 24 hours: Vitals:   04/30/18 0800 04/30/18 0900 04/30/18 1000 04/30/18 1100  BP: (!) 153/78 (!) 160/73 138/66 139/64  Pulse: (!) 52 (!) 48 (!) 42 (!) 47  Resp: 13 20 18 13   Temp:      TempSrc:      SpO2: 92% 95% 95% 91%  Weight:      Height:       Physical Exam  Constitutional: He is oriented to person, place, and time. He appears well-developed and well-nourished. No distress.  HENT:  Oropharynx dry, cracked lips  Eyes: Conjunctivae are normal. No scleral icterus.  No horizontal or vertical nystagmus noted.  Cardiovascular: Normal rate, regular rhythm and intact distal pulses. Exam reveals no friction rub.  No murmur heard. Respiratory: Effort normal. No respiratory distress. He has no wheezes. He has no rales.  GI: Soft. He exhibits no distension. There is no tenderness. There is no rebound.  Musculoskeletal: He exhibits no edema (of bilateral lower extremities) or tenderness (of bilateral lower extremities).  Neurological: He is alert and oriented to person, place, and time.  Face strength and sensation intact bilaterally. Tongue midline. Gross motor and sensation to light touch of upper and lower extremities intact bilaterally.  Skin: Skin is warm and dry. No rash noted. He is not diaphoretic. No erythema.   Assessment/Plan:  Active Problems:   Nausea and vomiting  Reginald [name] is a 77 yo with PMH of AAA (s/p stent in 2016), COPD, Asthma, DVT (2005), and BPH who  presented with vertigo, nausea, and vomiting. He was admitted to the internal medicine teaching service for management. The specific problems addressed during admission are as follows:  Vertigo: Stroke workup in ED negative and PE/history are consistent with BPPV and/or vestibular neuritis. Patient's symptoms of dizziness and nausea/vomiting appear to have improved after PT evaluation this AM. Patient no longer vomiting. Will continue supportive care of patient's symptoms with meclizine and phenergan. Since patient having difficulty tolerating PO 2/2 symptoms, will provide gentle IVF and PRN phenergan. Will also start with CLD and allow nursing to advance as tolerated.  -Vestibular PT consulted, their assistance and recommendations appreciated -Continue NS 100cc/hr with ongoing nausea, vomiting -Meclizine 25 mg BID -Phenergan PRN for nausea  COPD/Asthma: No current signs or symptoms of exacerbation. Continue on home fluticasone and albuterol PRN.  BPH: Chronic, stable. Continue home doxazosin and finasteride daily  FEN/GI: -CLD, advance to regular as tolerated -NS @ rate of 100 ml/hr, electrolytes within normal limits -Protonix  VTE Prophylaxis: Lovenox daily Code Status: Full  Dispo: Anticipated discharge in approximately 1-2 day(s).   [name], MD 04/30/2018, 1:10 PM Pager: [phone]

## 2018-04-30 NOTE — ED Notes (Addendum)
MD Raines notified about  bradycardia in to the 40's.

## 2018-04-30 NOTE — ED Notes (Signed)
Pt vomited a few minutes after Pepcid given.  MD Rayland notified.

## 2018-04-30 NOTE — ED Notes (Addendum)
MD Svalina called this RN on behalf of MD Rebeca Alert about bradycardia in the 40's.  She instructed me to monitor the patient and report bradycardia lower than 40 or any new CP/dizziness. Pt is currently A&O x4. Will continue to monitor.  (Pager [phone])

## 2018-05-01 ENCOUNTER — Other Ambulatory Visit: Payer: Self-pay

## 2018-05-01 ENCOUNTER — Encounter (HOSPITAL_COMMUNITY): Payer: Self-pay | Admitting: General Practice

## 2018-05-01 DIAGNOSIS — I4891 Unspecified atrial fibrillation: Secondary | ICD-10-CM

## 2018-05-01 DIAGNOSIS — N400 Enlarged prostate without lower urinary tract symptoms: Secondary | ICD-10-CM

## 2018-05-01 DIAGNOSIS — H5509 Other forms of nystagmus: Secondary | ICD-10-CM

## 2018-05-01 MED ORDER — ORAL CARE MOUTH RINSE
15.0000 mL | Freq: Two times a day (BID) | OROMUCOSAL | Status: DC
  Administered 2018-05-01 – 2018-05-14 (×22): 15 mL via OROMUCOSAL

## 2018-05-01 MED ORDER — METOPROLOL TARTRATE 5 MG/5ML IV SOLN
2.5000 mg | Freq: Four times a day (QID) | INTRAVENOUS | Status: DC | PRN
  Administered 2018-05-08: 2.5 mg via INTRAVENOUS
  Filled 2018-05-01 (×2): qty 5

## 2018-05-01 MED ORDER — MECLIZINE HCL 25 MG PO TABS
25.0000 mg | ORAL_TABLET | Freq: Three times a day (TID) | ORAL | Status: DC
Start: 2018-05-01 — End: 2018-05-03
  Administered 2018-05-01 – 2018-05-03 (×7): 25 mg via ORAL
  Filled 2018-05-01 (×7): qty 1

## 2018-05-01 MED ORDER — METOPROLOL TARTRATE 5 MG/5ML IV SOLN: 2.5000 mg | Freq: Once | INTRAVENOUS | Status: DC

## 2018-05-01 MED ORDER — METOPROLOL TARTRATE 5 MG/5ML IV SOLN: 2.5000 mg | Freq: Four times a day (QID) | INTRAVENOUS | Status: DC

## 2018-05-01 NOTE — Progress Notes (Signed)
CCMD reported that patient heart rhythm converted from normal sinus rhythm to atrial fibulation one hour previous to call.     05/01/18 1512  Vitals  Temp 98.1 F (36.7 C)  BP (!) 150/110  MAP (mmHg) 121  ECG Heart Rate (!) 106  Resp 20  Oxygen Therapy  SpO2 93 %  O2 Device Nasal Cannula  O2 Flow Rate (L/min) 2 L/min  Pulse Oximetry Type Continuous  Pain Assessment  Pain Scale 0-10  Pain Score 0    05/01/18 1512  Neurological  Neuro (WDL) X  Level of Consciousness Alert  Orientation Level Oriented X4  Cognition Appropriate at baseline  Speech Clear  Pupil Assessment  Yes  R Pupil Size (mm) 3  R Pupil Shape Round  R Pupil Reaction Brisk  L Pupil Size (mm) 3  L Pupil Shape Round  L Pupil Reaction Brisk  Additional Pupil Assessments No  Motor Function/Sensation Assessment Grip  R Hand Grip Strong  L Hand Grip Strong  Neuro Symptoms  (nystagmus)

## 2018-05-01 NOTE — Progress Notes (Signed)
    Subjective: Patient seen laying uncomfortably this AM with active emesis and intermittent dizziness. Patient states that he felt well throughout the afternoon yesterday but symptoms of nausea, vomiting, and dizziness returned upon waking this AM. No headache or other acute complaints. Patient actively vomiting upon initial evaluation this AM but symptoms of nausea improved after PRN phenergan administration upon repeat evaluation.  Objective:  Vital signs in last 24 hours: Vitals:   04/30/18 1512 04/30/18 2138 05/01/18 0637 05/01/18 0832  BP: (!) 152/65 (!) 168/82 (!) 169/79   Pulse: (!) 51 (!) 55 (!) 52   Resp: 18 18 19    Temp: 98.2 F (36.8 C) 98 F (36.7 C) 98.3 F (36.8 C)   TempSrc: Oral     SpO2: 95% 95% 93% 91%  Weight: 210 lb 12.2 oz (95.6 kg)     Height: 5\' 10"  (1.778 m)      Physical Exam  Constitutional: He is oriented to person, place, and time. He appears well-developed and well-nourished. No distress.  HENT:  Oropharynx dry, cracked lips, stable from previous exams  Eyes: Conjunctivae are normal. No scleral icterus.  Intermittent horizontal nystagmus.  Cardiovascular: Normal rate, regular rhythm and intact distal pulses. Exam reveals no friction rub.  No murmur heard. Respiratory: Effort normal. No respiratory distress. He has no wheezes. He has no rales.  Musculoskeletal: He exhibits no edema (of bilateral lower extremities) or tenderness (of bilateral lower extremities).  Neurological: He is alert and oriented to person, place, and time.  Face strength and sensation intact bilaterally. Gross motor and sensation to light touch of upper and lower extremities intact bilaterally.  Skin: Skin is warm and dry. No rash noted. He is not diaphoretic. No erythema.   Assessment/Plan:  Principal Problem:   Acute vestibular syndrome Active Problems:   Nausea and vomiting  Reginald [name] is a 77 yo with PMH of AAA (s/p stent in 2016), COPD, Asthma, DVT (2005), and BPH  who presented with vertigo, nausea, and vomiting. He was admitted to the internal medicine teaching service for management. The specific problems addressed during admission are as follows:  Vertigo: Stroke workup negative and PE/history are consistent with BPPV and/or vestibular neuritis. Patient's symptoms of dizziness and nausea/vomiting significantly improved after PT yesterday but returned this AM. Nausea seemed improved upon return to room with PRN phenergan. Patient would like to attempt Epley maneuver again this AM since it made him feel better yesterday, but requested that we wait until AM medications are given. In the interim patient encouraged to use PRN phenergan as needed to assist with eating. Will also increase meclizine to TID dosing since BID dosing was well tolerated yesterday without adverse event. -Vestibular PT consulted, their assistance and recommendations appreciated -Meclizine 25 mg TID -Phenergan PRN for nausea -CLD advance as tolerated  COPD/Asthma: No current signs or symptoms of exacerbation. Continue on home fluticasone and albuterol PRN.  BPH: Chronic, stable. Continue home doxazosin and finasteride daily  FEN/GI: -CLD, advance to regular as tolerated -No IVF, electrolytes within normal limits -Protonix  VTE Prophylaxis: Lovenox daily Code Status: Full  Dispo: Anticipated discharge in approximately 0-1 day(s).   [name], MD 05/01/2018, 10:04 AM Pager: [phone]

## 2018-05-01 NOTE — NC FL2 (Signed)
[place] LEVEL OF CARE SCREENING TOOL     IDENTIFICATION  Patient Name: Reginald [name] Birthdate: 09/18/1941 Sex: male Admission Date (Current Location): 04/29/2018  [facility] and [place] Number:  Herbalist and Address:  [place] [place], [place] [address], [place], [place] [address]      Provider Number: [id number]  Attending Physician Name and Address:  Oda Kilts, MD  Relative Name and Phone Number:  Arbie Cookey, spouse, [phone]    Current Level of Care: Hospital Recommended Level of Care: [place] Prior Approval Number:    Date Approved/Denied:   PASRR Number: [id number]  Discharge Plan: SNF    Current Diagnoses: Patient Active Problem List   Diagnosis Date Noted  . Acute vestibular syndrome 04/30/2018  . Nausea and vomiting 04/29/2018  . AAA (abdominal aortic aneurysm) ([place]) 01/26/2015  . Preop cardiovascular exam 01/10/2015  . Hyperlipidemia 01/10/2015  . Abdominal aortic aneurysm ([place]) 11/29/2011    Orientation RESPIRATION BLADDER Height & Weight     Self, Time, Situation, Place  O2(Nasal cannula 2L) Incontinent, External catheter Weight: 95.6 kg (210 lb 12.2 oz) Height:  5\' 10"  (177.8 cm)  BEHAVIORAL SYMPTOMS/MOOD NEUROLOGICAL BOWEL NUTRITION STATUS      Continent Diet(Please see DC Summary)  AMBULATORY STATUS COMMUNICATION OF NEEDS Skin   Limited Assist Verbally Normal                       Personal Care Assistance Level of Assistance  Bathing, Feeding, Dressing Bathing Assistance: Maximum assistance Feeding assistance: Limited assistance Dressing Assistance: Limited assistance     Functional Limitations Info  Sight, Hearing, Speech Sight Info: Adequate Hearing Info: Adequate Speech Info: Adequate    SPECIAL CARE FACTORS FREQUENCY  PT (By licensed PT), OT (By licensed OT)     PT Frequency: 5x/week OT Frequency: 3x/week            Contractures      Additional  Factors Info  Code Status, Allergies Code Status Info: Full Allergies Info: NKA           Current Medications (05/01/2018):  This is the current hospital active medication list Current Facility-Administered Medications  Medication Dose Route Frequency Provider Last Rate Last Dose  . acetaminophen (TYLENOL) tablet 650 mg  650 mg Oral Q6H PRN Alphonzo Grieve, MD       Or  . acetaminophen (TYLENOL) suppository 650 mg  650 mg Rectal Q6H PRN Alphonzo Grieve, MD      . budesonide (PULMICORT) nebulizer solution 0.25 mg  0.25 mg Nebulization Daily Oda Kilts, MD   0.25 mg at 05/01/18 1829  . doxazosin (CARDURA) tablet 4 mg  4 mg Oral QHS Alphonzo Grieve, MD   4 mg at 04/30/18 2306  . enoxaparin (LOVENOX) injection 40 mg  40 mg Subcutaneous Q24H Alphonzo Grieve, MD   40 mg at 05/01/18 1004  . finasteride (PROSCAR) tablet 5 mg  5 mg Oral QHS Alphonzo Grieve, MD   5 mg at 04/30/18 2307  . meclizine (ANTIVERT) tablet 25 mg  25 mg Oral TID [name], MD   25 mg at 05/01/18 1004  . MEDLINE mouth rinse  15 mL Mouth Rinse BID Oda Kilts, MD      . metoprolol tartrate (LOPRESSOR) injection 2.5 mg  2.5 mg Intravenous Q6H PRN Nedrud, Larena Glassman, MD      . promethazine (PHENERGAN) injection 12.5 mg  12.5 mg Intravenous Q6H PRN Lacroce,  Hulen Shouts, MD   12.5 mg at 05/01/18 0735   Or  . promethazine (PHENERGAN) tablet 12.5 mg  12.5 mg Oral Q6H PRN Melanee Spry, MD         Discharge Medications: Please see discharge summary for a list of discharge medications.  Relevant Imaging Results:  Relevant Lab Results:   Additional Information SSN: [place]  [place], [place]

## 2018-05-01 NOTE — Progress Notes (Addendum)
05/01/18 1600  PT Visit Information  Last PT Received On 05/01/18  Assistance Needed +2  History of Present Illness [name] is a 31 to M with Hx of AAA (s/p stent in 2016), COPD, Asthma, DVT (2005), and BPH who presented with nausea and vomiting. History provided by patient and his son (who was at bedside) Patient reports 4 days of nausea and vomiting. The symptoms have been constant and progressive over the past 4 days. He experiences nausea and vomiting with position changes and has had some associated ataxia that has worsened of the past 4 days (son state this appears to have worsened as he has become weaker). Patient has only been able to keep down sips of water and some chicken noodle soup since symptoms began. On the second day he started taking Zofran, which was called in by his PCP, but this provided only minimal relief.   Subjective Data  Patient Stated Goal to go home  Precautions  Precautions Fall  Restrictions  Weight Bearing Restrictions No  Pain Assessment  Pain Assessment No/denies pain  Cognition  Arousal/Alertness Awake/alert  Behavior During Therapy Flat affect  Overall Cognitive Status Within Functional Limits for tasks assessed  Bed Mobility  Overal bed mobility Needs Assistance  Bed Mobility Rolling;Sidelying to Sit;Sit to Sidelying  Rolling Mod assist;+2 for physical assistance  Sidelying to sit +2 for physical assistance;Mod assist  Sit to sidelying Mod assist;+2 for physical assistance  General bed mobility comments Difficult to get pt to roll and come to EOB due to treating vertigo.  Pt was nauseated and dizzy.   Balance  Overall balance assessment Needs assistance  Sitting-balance support No upper extremity supported;Feet supported  Sitting balance-Leahy Scale Fair  Sitting balance - Comments relies on UEs for balance in sitting initially after the Epley but progressed to being able to sit without support.   General Comments  General comments (skin  integrity, edema, etc.) Pt reports still had dizziness.  Decided to initiate with Nestor Lewandowsky trying to break crystals loose.  Retested pt after with Modified hallpike with pt with left anterior canal BPPV.  Treated with Epley maneuver.  At end of treatment noted that pt had ocular spasms horizontally.  Checked pt's strength, coordination and symmetry which pt was WNL.  Of note, nurse came in and stated that central tele called and pt was now in afib.  HR was 110 bpm.  Nurse assessing pt further and calling MD.    Exercises  Exercises Other exercises  Other Exercises  Other Exercises Reviewed Nestor Lewandowsky exercises.   PT - End of Session  Equipment Utilized During Treatment Gait belt;Oxygen  Activity Tolerance Patient limited by fatigue (limited by nausea)  Patient left with call bell/phone within reach;with family/visitor present;in chair;with nursing/sitter in room  Nurse Communication Mobility status   PT - Assessment/Plan  PT Plan Discharge plan needs to be updated  PT Visit Diagnosis Unsteadiness on feet (R26.81);Muscle weakness (generalized) (M62.81);BPPV;Dizziness and giddiness (R42)  BPPV - Right/Left  Right;Left  PT Frequency (ACUTE ONLY) Min 3X/week  Follow Up Recommendations SNF;Supervision/Assistance - 24 hour (Pt wife just enrolled in  [facility] Instead program)  PT equipment Rolling walker with 5" wheels;3in1 (PT)  AM-PAC PT "6 Clicks" Daily Activity Outcome Measure  Difficulty turning over in bed (including adjusting bedclothes, sheets and blankets)? 2  Difficulty moving from lying on back to sitting on the side of the bed?  2  Difficulty sitting down on and standing up from a chair  with arms (e.g., wheelchair, bedside commode, etc,.)? 2  Help needed moving to and from a bed to chair (including a wheelchair)? 2  Help needed walking in hospital room? 2  Help needed climbing 3-5 steps with a railing?  1  6 Click Score 11  Mobility G Code  CL  PT Goal Progression  Progress  towards PT goals Progressing toward goals  PT Time Calculation  PT Start Time (ACUTE ONLY) 1435  PT Stop Time (ACUTE ONLY) 1517  PT Time Calculation (min) (ACUTE ONLY) 42 min  PT General Charges  $$ ACUTE PT VISIT 1 Visit  PT Treatments  $Therapeutic Exercise 8-22 mins  $Therapeutic Activity 23-37 mins  Pt was able to tolerate further testing however each time PT comes in, it appears that pt has differing symptoms.  Have treated pt for each type of BPPV that PT sees.  Will ask a coworker to come in tomorrow to assist with assessment to determine if any other treatments are suggested.  Pt has felt better after each therapy session.  Given that pt has not progressed as anticipated, recommend SNF at d/c for therapy to reach more independent level as wife is recovering from surgery.  Will continue PT.  [place] [phone] (pager)

## 2018-05-01 NOTE — Progress Notes (Signed)
Paged bedside to evaluate new onset A-Fib noticed during vestibular PT session this afternoon.  Patient continues to endorse ongoing dizziness and intermittent nausea, however denies chest pain, SOB, and palpitations.   HR: 80s-100s, BP 170/80, 90% O2 on room air Gen: patient sitting comfortably in bedside chair non diaphoretic in no acute distress CV: Pulse irregular, non-tachycardic Pulm: No accessory muscle use or nasal flaring. Clear to Auscultation in atnerior lung fields  EKG with atrial fibrillation, HR around 100s  A/P: Patient with new onset A-Fib, which is not in patient's history per discussion with family who was bedside. Patient without cardiac complaints, but ongoing nausea, vomiting, and dizziness 2/2 vertigo. Patient currently hemodynamically stable and HR consistently <100 during discussion with family in the room. Patient previously with bradycardia with HR in 50s throughout admission. Since HR below 100 and hemodynamically stable, will place PRN metoprolol order for HR >140. Will continue telemetry monitoring. Hopefully patient will convert back to sinus rhythm overnight. If he does not, will consult cardiology in AM.

## 2018-05-01 NOTE — Progress Notes (Signed)
  Date: 05/01/2018  Patient name: Reginald [name]  Medical record number: [id number]  Date of birth: [birth year]   I have seen and evaluated this patient and I have discussed the plan of care with the house staff. Please see their note for complete details. I concur with their findings with the following additions/corrections:   Felt much better yesterday after Epley canalith repositioning maneuver. However, woke up this morning feeling dizzy and nauseated again, vomited twice. During my evaluation, he had some dizziness and brief lateral nystagmus with head rotation. Plan for repeat Epley today, but had just received promethazine and had not yet received meclizine, would prefer to perform after having medications. Appreciate PT assistance with Epley again today as well as "BBQ rooll" for horizontal canal treatment with reportedly good effect. The vast majority of patients do not require more than 3 treatment maneuvers, so hopeful that he will be able to proceed symptom-free from here.   Noted to have new onset atrial fibrillation this afternoon. It appears this would be a new diagnosis for him. EKG reviewed, confirms Afib with rate 98, one PVC, no signs of active ischemia. No hypotension or chest pain. Currently rate controlled without medication, will watch HR for now with goal <110. Check TSH and TTE in am.   Lenice Pressman, M.D., Ph.D. 05/01/2018, 4:17 PM

## 2018-05-01 NOTE — Progress Notes (Signed)
Physical Therapy Treatment Patient Details Name: Reginald [name] MRN: [id number] DOB: 03/26/1941 Today's Date: 05/01/2018    History of Present Illness Reginald [name] is a 34 to M with Hx of AAA (s/p stent in 2016), COPD, Asthma, DVT (2005), and BPH who presented with nausea and vomiting. History provided by patient and his son (who was at bedside) Patient reports 4 days of nausea and vomiting. The symptoms have been constant and progressive over the past 4 days. He experiences nausea and vomiting with position changes and has had some associated ataxia that has worsened of the past 4 days (son state this appears to have worsened as he has become weaker). Patient has only been able to keep down sips of water and some chicken noodle soup since symptoms began. On the second day he started taking Zofran, which was called in by his PCP, but this provided only minimal relief.     PT Comments    Pt admitted with above diagnosis. Pt currently with functional limitations due to the deficits listed below (see PT Problem List). Pt was able to ambulate only a short distance with mod assist of 2 persons and RW.  Pt tested positive for left BPPV posterior canal and treated with Epley maneuver.  Then pt with symptoms of left horizontal canal BPPV therefore treated with BBQ roll.  Pt reports no dizziness or nausea post treatment.  Plan to return at 3:00 pm to reassess and treat pt as needed.  Pt will benefit from skilled PT to increase their independence and safety with mobility to allow discharge to the venue listed below.     Follow Up Recommendations  Home health PT;Supervision/Assistance - 24 hour(Pt wife just enrolled in  [facility] Instead program)     Equipment Recommendations  Rolling walker with 5" wheels;3in1 (PT)    Recommendations for Other Services       Precautions / Restrictions Precautions Precautions: Fall Restrictions Weight Bearing Restrictions: No    Mobility  Bed Mobility Overal bed mobility:  Needs Assistance Bed Mobility: Rolling;Sidelying to Sit;Sit to Sidelying Rolling: Mod assist;+2 for physical assistance Sidelying to sit: Max assist;+2 for physical assistance     Sit to sidelying: Mod assist;+2 for physical assistance General bed mobility comments: Difficult to get pt to roll and come to EOB due to treating vertigo.  Pt was nauseated and dizzy.   Transfers Overall transfer level: Needs assistance Equipment used: Rolling walker (2 wheeled) Transfers: Sit to/from Stand Sit to Stand: Mod assist;+2 safety/equipment         General transfer comment: Pt unsteady due to vertigo  Ambulation/Gait Ambulation/Gait assistance: Mod assist;+2 physical assistance Ambulation Distance (Feet): 20 Feet Assistive device: Rolling walker (2 wheeled) Gait Pattern/deviations: Step-through pattern;Decreased stride length;Drifts right/left;Trunk flexed     General Gait Details: Pt needs mod assist to ambulate short distance with RW.  Unsteady on feet.    Stairs             Wheelchair Mobility    Modified Rankin (Stroke Patients Only)       Balance Overall balance assessment: Needs assistance Sitting-balance support: No upper extremity supported;Feet supported Sitting balance-Leahy Scale: Fair     Standing balance support: Bilateral upper extremity supported;During functional activity Standing balance-Leahy Scale: Poor Standing balance comment: relies on UE support                            Cognition Arousal/Alertness: Awake/alert Behavior During Therapy: Flat  affect Overall Cognitive Status: Within Functional Limits for tasks assessed                                        Exercises Other Exercises Other Exercises: Reviewed Reginald [name] exercises.     General Comments General comments (skin integrity, edema, etc.): Today, pt with differing symptoms.  Noted left posterior canal BPPV and treated with Epley maneuver.  Then noted  horizontal canal BPPV on left therefore completed BBQ roll for this.  Pt reports no nausea or dizziness after these treatments.  sons given education regarding BPPV treatments and Reginald [name].  Showed them a video as to process of BPPV.        Pertinent Vitals/Pain Pain Assessment: No/denies pain    Home Living                      Prior Function            PT Goals (current goals can now be found in the care plan section) Acute Rehab PT Goals Patient Stated Goal: to go home Progress towards PT goals: Progressing toward goals    Frequency    Min 3X/week      PT Plan Current plan remains appropriate    Co-evaluation              AM-PAC PT "6 Clicks" Daily Activity  Outcome Measure  Difficulty turning over in bed (including adjusting bedclothes, sheets and blankets)?: A Lot Difficulty moving from lying on back to sitting on the side of the bed? : A Lot Difficulty sitting down on and standing up from a chair with arms (e.g., wheelchair, bedside commode, etc,.)?: A Lot Help needed moving to and from a bed to chair (including a wheelchair)?: A Lot Help needed walking in hospital room?: A Lot Help needed climbing 3-5 steps with a railing? : Total 6 Click Score: 11    End of Session Equipment Utilized During Treatment: Gait belt;Oxygen Activity Tolerance: Patient limited by fatigue(limited by nausea) Patient left: in bed;with call bell/phone within reach;with family/visitor present Nurse Communication: Mobility status PT Visit Diagnosis: Unsteadiness on feet (R26.81);Muscle weakness (generalized) (M62.81);BPPV;Dizziness and giddiness (R42) BPPV - Right/Left : Right;Left     Time: 3734-2876 PT Time Calculation (min) (ACUTE ONLY): 56 min  Charges:  $Gait Training: 8-22 mins $Therapeutic Exercise: 8-22 mins $Canalith Rep Proc: 8-22 mins                    G Codes:       Reginald [name],PT Acute Rehabilitation [phone]  (pager)    Reginald [name] 05/01/2018, 12:21 PM

## 2018-05-01 NOTE — Plan of Care (Signed)
Patient will remain free of falls and pain will be to a minimum.

## 2018-05-02 ENCOUNTER — Other Ambulatory Visit (HOSPITAL_COMMUNITY): Payer: Medicare Other

## 2018-05-02 DIAGNOSIS — R42 Dizziness and giddiness: Secondary | ICD-10-CM

## 2018-05-02 DIAGNOSIS — I480 Paroxysmal atrial fibrillation: Secondary | ICD-10-CM

## 2018-05-02 DIAGNOSIS — H8190 Unspecified disorder of vestibular function, unspecified ear: Secondary | ICD-10-CM

## 2018-05-02 LAB — BASIC METABOLIC PANEL
Anion gap: 12 (ref 5–15)
BUN: 22 mg/dL — ABNORMAL HIGH (ref 6–20)
CO2: 26 mmol/L (ref 22–32)
Calcium: 9.1 mg/dL (ref 8.9–10.3)
Chloride: 98 mmol/L — ABNORMAL LOW (ref 101–111)
Creatinine, Ser: 1.09 mg/dL (ref 0.61–1.24)
GFR calc Af Amer: >60 mL/min (ref >60)
GFR calc non Af Amer: >60 mL/min (ref >60)
Glucose, Bld: 124 mg/dL — ABNORMAL HIGH (ref 65–99)
Potassium: 3.1 mmol/L — ABNORMAL LOW (ref 3.5–5.1)
Sodium: 136 mmol/L (ref 135–145)

## 2018-05-02 LAB — MAGNESIUM: Magnesium: 2.3 mg/dL (ref 1.7–2.4)

## 2018-05-02 LAB — TSH: TSH: 0.462 u[IU]/mL (ref 0.350–4.500)

## 2018-05-02 MED ORDER — ONDANSETRON HCL 4 MG/2ML IJ SOLN
4.0000 mg | Freq: Three times a day (TID) | INTRAMUSCULAR | Status: DC | PRN
  Administered 2018-05-02 – 2018-05-15 (×17): 4 mg via INTRAVENOUS
  Filled 2018-05-02 (×18): qty 2

## 2018-05-02 MED ORDER — APIXABAN 5 MG PO TABS
5.0000 mg | ORAL_TABLET | Freq: Two times a day (BID) | ORAL | Status: DC
  Administered 2018-05-02 – 2018-05-04 (×5): 5 mg via ORAL
  Filled 2018-05-02 (×6): qty 1

## 2018-05-02 MED ORDER — DILTIAZEM HCL ER COATED BEADS 120 MG PO CP24
120.0000 mg | ORAL_CAPSULE | Freq: Every day | ORAL | Status: DC
  Administered 2018-05-02 – 2018-05-14 (×13): 120 mg via ORAL
  Filled 2018-05-02 (×15): qty 1

## 2018-05-02 MED ORDER — POTASSIUM CHLORIDE 10 MEQ/100ML IV SOLN
10.0000 meq | INTRAVENOUS | Status: AC
  Administered 2018-05-02 (×4): 10 meq via INTRAVENOUS
  Filled 2018-05-02 (×4): qty 100

## 2018-05-02 NOTE — Consult Note (Addendum)
Cardiology Consultation:   Patient ID: Reginald [name]; [id number]; December 11, 1940   Admit date: 04/29/2018 Date of Consult: 05/02/2018  Primary Care Provider: Christain Sacramento, MD Primary Cardiologist: Remotely Dr. Stanford Breed for pre-op eval Primary Electrophysiologist:  None   Patient Profile:   Reginald [name] is a 77 y.o. male with a PMH of AAA (s/p stent in 2016), COPD, Asthma, DVT (2005), and PMH who is being seen today for the evaluation of atrial fibrillation at the request of Dr. Rebeca Alert.  History of Present Illness:   Reginald [name] was in his usual state of health until 1 week ago when he developed intractable nausea and vomiting. He was admitted to the hospital 04/29/18 for BPPV which has proven difficult to control despite vestibular PT, meclizine, and antiemetics.   On 05/02/18, he was found to be in new onset atrial fibrillation while working with vestibular PT. Patient was without complaints of palpitations, SOB, or chest pain at the time. He continued to feel dizziness and intermittent nausea 2/2 vertigo.   Patient denies prior cardiac history. He was seen in 2016 by Dr. Stanford Breed for preoperative assessment piror to his AAA stent placement. He underwent a NST at that time given history of HLD, former tobacco abuse, and family history of CAD which was negative for ischemia. He has not had any cardiac testing since that time. He denies history of exertional CP or CP at rest, SOB, DOE, LE edema, orthopnea, PND, recent URI, or fevers. He did present to his PCP office on 04/17/18 with complaints of hematuria, thought to be 2/2 UTI and he completed a 10d course of cipro. Hematuria has resolved and he denies melena, hematochezia, or hematemesis.   Hospital course: hypertensive over the past 24 hours, bradycardic in the 50s yesterday prior to afib onset, otherwise VSS. Labs notable for K 3.1 (receiving IV K 10 mEq x4 doses now), Cr 1.09, Hgb 14.5, PLT 216, TSH wnl. 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 MRI brain without acute findings and severe chronic microvascular change. EKG on admission with sinus bradycardia, repeat 05/01/18 with atrial fibrillation, rate 98 with PVC. Patient has been receiving aggressive vertigo management with vestibular PT, meclizine, and antiemetics. He was started on IV metoprolol prn for HR >140 but has not needed any doses. Cardiology asked to evaluate for new onset atrial fibrillation.  Past Medical History:  Diagnosis Date  . AAA (abdominal aortic aneurysm) ([place])   . Arthritis   . Asthma   . COPD (chronic obstructive pulmonary disease) ([place])   . DVT (deep venous thrombosis) ([place]) 2005   Right  leg  . Headache    cluster headaches  . Hyperlipidemia   . Joint pain   . Shortness of breath dyspnea    exertion  . Vertigo 04/29/2018    Past Surgical History:  Procedure Laterality Date  . ABDOMINAL AORTIC ENDOVASCULAR STENT GRAFT N/A 01/26/2015   Procedure: ABDOMINAL AORTIC ENDOVASCULAR STENT GRAFT;  Surgeon: Elam Dutch, MD;  Location: [facility];  Service: Vascular;  Laterality: N/A;  . FRACTURE SURGERY  2005   Right heel - Fx and NO SURGERY     Home Medications:  Prior to Admission medications   Medication Sig Start Date End Date Taking? Authorizing Provider  albuterol (PROVENTIL HFA;VENTOLIN HFA) 108 (90 BASE) MCG/ACT inhaler Inhale 2 puffs into the lungs every 4 (four) hours as needed for shortness of breath.    Yes [provider]  aspirin 325 MG tablet Take 325 mg by mouth at bedtime.    Yes [provider]  ciprofloxacin (CIPRO) 500 MG tablet Take 500 mg by mouth 2 (two) times daily. 04/17/18  Yes [provider]  clobetasol cream (TEMOVATE) 4.76 % Apply 1 application topically 2 (two) times daily as needed (for dry skin).  11/23/12  Yes [provider]  doxazosin (CARDURA) 4 MG tablet Take 4 mg by mouth at bedtime.    Yes [provider]  finasteride  (PROSCAR) 5 MG tablet Take 5 mg by mouth at bedtime.  10/20/12  Yes [provider]  fluticasone (FLONASE) 50 MCG/ACT nasal spray Place 2 sprays into both nostrils daily.    Yes [provider]  fluticasone (FLOVENT HFA) 44 MCG/ACT inhaler Inhale 2 puffs into the lungs every other day.    Yes [provider]  methocarbamol (ROBAXIN) 500 MG tablet Take 500 mg by mouth as needed for muscle spasms (Muscle spasms).    Yes [provider]  ondansetron (ZOFRAN) 4 MG tablet Take 4 mg by mouth daily as needed. 04/28/18  Yes [provider]  topiramate (TOPAMAX) 50 MG tablet Take 50 mg by mouth as needed.   Yes [provider]  triamcinolone ointment (KENALOG) 0.1 % Apply 1 application topically as needed (for dry skin).  12/22/14  Yes [provider]    Inpatient Medications: Scheduled Meds: . budesonide (PULMICORT) nebulizer solution  0.25 mg Nebulization Daily  . doxazosin  4 mg Oral QHS  . enoxaparin (LOVENOX) injection  40 mg Subcutaneous Q24H  . finasteride  5 mg Oral QHS  . meclizine  25 mg Oral TID  . mouth rinse  15 mL Mouth Rinse BID   Continuous Infusions: . potassium chloride 10 mEq (05/02/18 1315)   PRN Meds: acetaminophen **OR** acetaminophen, metoprolol tartrate, promethazine **OR** promethazine  Allergies:   No Known Allergies  Social History:   Social History   Socioeconomic History  . Marital status: Married    Spouse name: Not on file  . Number of children: 2  . Years of education: Not on file  . Highest education level: Not on file  Occupational History  . Not on file  Social Needs  . Financial resource strain: Not on file  . Food insecurity:    Worry: Not on file    Inability: Not on file  . Transportation needs:    Medical: Not on file    Non-medical: Not on file  Tobacco Use  . Smoking status: Former Smoker    Packs/day: 1.00    Years: 30.00    Pack years: 30.00    Types: E-cigarettes,  Cigarettes    Last attempt to quit: 03/05/2012    Years since quitting: 6.1  . Smokeless tobacco: Never Used  Substance and Sexual Activity  . Alcohol use: No    Alcohol/week: 0.0 oz  . Drug use: No  . Sexual activity: Not on file  Lifestyle  . Physical activity:    Days per week: Not on file    Minutes per session: Not on file  . Stress: Not on file  Relationships  . Social connections:    Talks on phone: Not on file    Gets together: Not on file    Attends religious service: Not on file    Active member of club or organization: Not on file    Attends meetings of clubs or organizations: Not on file    Relationship status:  Not on file  . Intimate partner violence:    Fear of current or ex partner: Not on file    Emotionally abused: Not on file    Physically abused: Not on file    Forced sexual activity: Not on file  Other Topics Concern  . Not on file  Social History Narrative  . Not on file    Family History:    Family History  Problem Relation Age of Onset  . Hyperlipidemia Mother   . Hypertension Mother   . Cancer Father        Bladder  . Hyperlipidemia Father   . Hypertension Father   . Kidney disease Father   . Heart disease Brother        Heart Disease before age 40  . Heart attack Brother        x's 2  . Hyperlipidemia Brother   . Hypertension Brother   . Hyperlipidemia Sister   . Hypertension Sister      ROS:  Please see the history of present illness.   All other ROS reviewed and negative.     Physical Exam/Data:   Vitals:   05/01/18 2044 05/02/18 0608 05/02/18 0608 05/02/18 1110  BP: (!) 148/89 138/86 138/86   Pulse: 80 93 93   Resp: 18 18 18    Temp: 98.6 F (37 C) 98.3 F (36.8 C) 98.3 F (36.8 C)   TempSrc: Oral Oral    SpO2: 92% 92% 92% 94%  Weight:      Height:        Intake/Output Summary (Last 24 hours) at 05/02/2018 1410 Last data filed at 05/01/2018 2130 Gross per 24 hour  Intake 305 ml  Output 650 ml  Net -345 ml   Filed  Weights   04/29/18 1042 04/30/18 1512  Weight: 218 lb (98.9 kg) 210 lb 12.2 oz (95.6 kg)   Body mass index is 30.24 kg/m.  General:  Elderly gentleman laying in bed in no acute distress HEENT: sclera anicteric, dry mucus membranes  Neck: no JVD Vascular: No carotid bruits; distal pulses 2+ bilaterally Cardiac:  normal S1, S2; IRIR; no murmurs, gallops, or rubs Lungs:  clear to auscultation bilaterally, no wheezing, rhonchi or rales  Abd: NABS, soft, nontender, no hepatomegaly Ext: no edema Musculoskeletal:  No deformities, BUE and BLE strength normal and equal Skin: warm and dry  Neuro:  +nystagmus Psych:  Normal affect   EKG:  The EKG was personally reviewed and demonstrates:  on admission with sinus bradycardia, repeat 05/01/18 with atrial fibrillation, rate 98 with PVC. Telemetry:  Telemetry was personally reviewed and demonstrates:  Atrial fibrillation onset around 2pm 05/01/18, he has had intermittent RVR since that time. Prior to afib onset, he was sinus brady with HR as low as 40.   Relevant CV Studies:  Echo pending  Laboratory Data:  Chemistry Recent Labs  Lab 04/29/18 1052 04/30/18 0416 05/02/18 0533  NA 139 142 136  K 3.9 3.8 3.1*  CL 103 104 98*  CO2 25 26 26   GLUCOSE 140* 109* 124*  BUN 19 24* 22*  CREATININE 1.23 1.14 1.09  CALCIUM 9.5 9.3 9.1  GFRNONAA 55* >60 >60  GFRAA >60 >60 >60  ANIONGAP 11 12 12     Recent Labs  Lab 04/29/18 1052  PROT 7.4  ALBUMIN 3.8  AST 18  ALT 14*  ALKPHOS 57  BILITOT 1.9*   Hematology Recent Labs  Lab 04/29/18 1052 04/30/18 0416  WBC 7.8 9.5  RBC  4.49 4.25  HGB 15.0 14.5  HCT 42.8 41.4  MCV 95.3 97.4  MCH 33.4 34.1*  MCHC 35.0 35.0  RDW 12.5 12.7  PLT 207 216   Cardiac EnzymesNo results for input(s): TROPONINI in the last 168 hours.  Recent Labs  Lab 04/29/18 1743  TROPIPOC 0.00    BNPNo results for input(s): BNP, PROBNP in the last 168 hours.  DDimer No results for input(s): DDIMER in the last 168  hours.  Radiology/Studies:  Ct Head Wo Contrast  Result Date: 04/29/2018 CLINICAL DATA:  Nausea, emesis and fatigue beginning 3 days ago. EXAM: CT HEAD WITHOUT CONTRAST TECHNIQUE: Contiguous axial images were obtained from the base of the skull through the vertex without intravenous contrast. COMPARISON:  12/01/2012 FINDINGS: Brain: Generalized brain atrophy. Extensive chronic small-vessel ischemic changes throughout the cerebral hemispheric white matter. 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 No sign of acute infarction, mass lesion, hemorrhage, hydrocephalus or extra-axial collection. Findings are slightly progressive since 2013. Vascular: There is atherosclerotic calcification of the major vessels at the base of the brain. Skull: Negative Sinuses/Orbits: Clear/normal Other: None IMPRESSION: No acute finding by CT. Advanced chronic appearing small vessel ischemic changes of the cerebral hemispheric white matter, somewhat progressive since 2013. 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 Electronically Signed   By: Nelson Chimes M.D.   On: 04/29/2018 17:02   Mr Brain Wo Contrast  Result Date: 04/29/2018 CLINICAL DATA:  Nausea and vomiting, fatigue for 3 days. Assess persistent central vertigo. EXAM: MRI HEAD WITHOUT CONTRAST TECHNIQUE: Multiplanar, multiecho pulse sequences of the brain and surrounding structures were obtained without intravenous contrast. COMPARISON:  CT HEAD Apr 29, 2018 FINDINGS: INTRACRANIAL CONTENTS: No reduced diffusion to suggest acute ischemia. Faint punctate susceptibility artifact RIGHT cerebrum may be extra-axial. The ventricles and sulci are normal for patient's age. No mass effect or masses. Confluent supratentorial white matter FLAIR T2 hyperintensities. 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 No abnormal extra-axial fluid collections. No extra-axial masses. VASCULAR: Normal major intracranial vascular flow voids present at skull base. SKULL AND UPPER CERVICAL SPINE: No  abnormal sellar expansion. No suspicious calvarial bone marrow signal. Craniocervical junction maintained. SINUSES/ORBITS: RIGHT frontal mucosal retention cysts in trace paranasal sinus mucosal thickening without air-fluid levels. Mastoid air cells are well aerated. Included ocular globes and orbital contents are non-suspicious. OTHER: None. IMPRESSION: 1. No acute intracranial process. 2. 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 3. Severe chronic small vessel ischemic changes. Old small cerebellar infarcts. Electronically Signed   By: Elon Alas M.D.   On: 04/29/2018 20:33    Assessment and Plan:   1. New onset atrial fibrillation: patient found to be in atrial fibrillation while working with vestibular PT yesterday. He was without complaints of CP, SOB, or palpitations. EKG with atrial fibrillation with rate 98, non-ischemic. TSH wnl. He has had intermittent RVR since onset of afib, although prior to afib onset, his baseline rhythm was sinus bradycardia with rates as low as 40.  - This patients CHA2DS2-VASc Score and unadjusted Ischemic Stroke Rate (% per year) is equal to 4.8 % stroke rate/year from a score of 4 Above score calculated as 1 point each if present [CHF, HTN, DM, Vascular=MI/PAD/Aortic Plaque, Age if 65-74, or Male] Above score calculated as 2 points each if present [Age > 75, or Stroke/TIA/TE] - Will start eliquis 5mg  BID for anticoagulation - Discontinue home ASA 325mg  daily - patient reports taking this ever since his provoked DVT 10 years ago.  -  Echo pending  - Would start low dose diltiazem 120mg  and monitor response.   2. Vertigo: continues to have ongoing vertigo despite aggressive vestibular PT, meclizine, and antiemetics. Primary team considering ENT input - Continue management per primary team  3. AAA s/p stenting: underwent successful stenting in 2016. Last aortic duplex 01/2018 without evidence of endo leak and  measured 4.2 (previously 4.0 03/2017) - Continue routine monitoring   For questions or updates, please contact [place] Please consult [email or web address] for contact info under Cardiology/STEMI.   Signed, Abigail Butts, PA-C  05/02/2018 2:10 PM [phone]  Personally seen and examined. Agree with above.  77 year old with vertiginous symptoms, nystagmus with atrial fibrillation paroxysmal.  Currently no chest pain, no shortness of breath.  Does not have much to say.  Family at bedside.  Nystagmus noted upon entering the room and meeting him.  Nausea bag at bedside.  GEN: Elderly, mouth open, in no distress HEENT: Nystagmus noted Neck: no JVD, carotid bruits, or masses Cardiac: IRRR; no murmurs, rubs, or gallops,no edema  Respiratory:  clear to auscultation bilaterally, normal work of breathing GI: soft, nontender, nondistended, + BS MS: no deformity or atrophy  Skin: warm and dry, no rash Neuro: Nystagmus Psych: euthymic mood, full affect   Lab work, EKG personally reviewed, telemetry personally reviewed.  Atrial fibrillation 2 PM yesterday.  Intermittent.  Previously, sinus bradycardia noted sometimes as low as 40 at night.  Assessment and plan:  Paroxysmal atrial fibrillation - I will try low-dose diltiazem 120 mg CD once a day.  Of course, if he converts, we may end up seeing significant bradycardia and we will have to stop this medication. - We will go ahead and start Eliquis for anticoagulation given his elevated chads vas score.  Echocardiogram currently pending.  Vertigo -Per maintained.  Challenging, vestibular therapy.  Nystagmus noted today.  AAA endovascular repair - Stable.  Candee Furbish, MD

## 2018-05-02 NOTE — Progress Notes (Signed)
Informed Dr Berneice Gandy that patient has been drowsy all day and still complaining of nausea. Asked MD if she still wants patient to received Phenergan with patient being so drowsy. MD to change medication to Zofran.

## 2018-05-02 NOTE — Progress Notes (Signed)
Notified MD oncall that pt had thick green secretions in back of throat this morning per NT. NT stated she got him to cough it up. Will continue to monitor pt. Ranelle Oyster RN

## 2018-05-02 NOTE — Clinical Social Work Note (Signed)
Clinical Social Work Assessment  Patient Details  Name: Reginald [name] MRN: [id number] Date of Birth: 01/02/1941  Date of referral:  05/02/18               Reason for consult:  Facility Placement                Permission sought to share information with:  Facility Sport and exercise psychologist, Family Supports Permission granted to share information::  Yes, Verbal Permission Granted  Name::     Biochemist, clinical::  SNFs  Relationship::  Son  Contact Information:  [phone]  Housing/Transportation Living arrangements for the past 2 months:  Single Family Home Source of Information:  Patient, Adult Children Patient Interpreter Needed:  None Criminal Activity/Legal Involvement Pertinent to Current Situation/Hospitalization:  No - Comment as needed Significant Relationships:  Adult Children, Spouse Lives with:  Spouse Do you feel safe going back to the place where you live?  No Need for family participation in patient care:  Yes (Comment)  Care giving concerns:  CSW received consult for possible SNF placement at time of discharge. CSW spoke with patient and son regarding PT recommendation of SNF placement at time of discharge. Patient reported being very nauseous so son lead the conversation. Patient's son reported that patient's spouse is currently unable to care for patient at their home given patient's current physical needs and fall risk. Patient expressed understanding of PT recommendation and is agreeable to SNF placement at time of discharge. CSW to continue to follow and assist with discharge planning needs.   Social Worker assessment / plan:  CSW spoke with patient and son concerning possibility of rehab at [facility] before returning home.  Employment status:  Retired Forensic scientist:  Medicare PT Recommendations:  [place] / Referral to community resources:  [place]  Patient/Family's Response to care:  Patient recognizes need for rehab  before returning home and is agreeable to a SNF in [place]. Patient reported preference for [facility]. Patient's son stated that he is flexible if they do not have a bed available at time of discharge.   Patient/Family's Understanding of and Emotional Response to Diagnosis, Current Treatment, and Prognosis:  Patient/family is realistic regarding therapy needs and expressed being hopeful for SNF placement. Patient's son expressed understanding of CSW role and discharge process as well as medical condition. No questions/concerns about plan or treatment.    Emotional Assessment Appearance:  Appears older than stated age Attitude/Demeanor/Rapport:  Lethargic Affect (typically observed):  Accepting Orientation:  Oriented to Self, Oriented to Place, Oriented to Situation, Oriented to  Time Alcohol / Substance use:  Not Applicable Psych involvement (Current and /or in the community):  No (Comment)  Discharge Needs  Concerns to be addressed:  Care Coordination Readmission within the last 30 days:  No Current discharge risk:  Physical Impairment Barriers to Discharge:  Continued Medical Work up   [employer], [place] 05/02/2018, 2:26 PM

## 2018-05-02 NOTE — Progress Notes (Signed)
Physical Therapy Treatment Patient Details Name: Reginald [name] MRN: [id number] DOB: June 10, 1941 Today's Date: 05/02/2018    History of Present Illness Mr Ulbricht is a 46 to M with Hx of AAA (s/p stent in 2016), COPD, Asthma, DVT (2005), and BPH who presented with nausea and vomiting. History provided by patient and his son (who was at bedside) Patient reports 4 days of nausea and vomiting. The symptoms have been constant and progressive over the past 4 days. He experiences nausea and vomiting with position changes and has had some associated ataxia that has worsened of the past 4 days (son state this appears to have worsened as he has become weaker). Patient has only been able to keep down sips of water and some chicken noodle soup since symptoms began. On the second day he started taking Zofran, which was called in by his PCP, but this provided only minimal relief.     PT Comments    Pt with new onset opsiclonus-type eye movements that were new since yesterday.  RN made aware and RN paged MD.  It will be difficult to determine any positional test with this new spatic eye movement as they will give false positives.  Pt with new onset A-fib yesterday and rate went up to 150s with pt seated EOB (RN made aware of this as well).  A bit of research revealed that opsiclonus is related to neurodegenerative disorders (not likely for this pt as he had sudden onset) vs potential medication side effect? This could be more likely given he has had some changes to his meds here?  He had myoclonic-type jerking in sitting EOB as well.   PT will continue to follow with team to problem solve this complicated patient.    Follow Up Recommendations  SNF     Equipment Recommendations  Rolling walker with 5" wheels;3in1 (PT)    Recommendations for Other Services   NA     Precautions / Restrictions Precautions Precautions: Fall    Mobility  Bed Mobility Overal bed mobility: Needs Assistance Bed Mobility: Supine to  Sit;Sit to Supine     Supine to sit: Mod assist;+2 for safety/equipment;HOB elevated Sit to supine: +2 for physical assistance;Mod assist   General bed mobility comments: Two person mod assist with HOB elevated.  Pt pitching forward having to be held          Balance Overall balance assessment: Needs assistance Sitting-balance support: Feet supported;Bilateral upper extremity supported Sitting balance-Leahy Scale: Poor Sitting balance - Comments: mod assist EOB to keep pt from falling forward.  Pt is shaking and jerkey in sitting.   Postural control: Other (comment)(forward pitch.  )                                  Cognition Arousal/Alertness: Lethargic(but arouses to voice and movement.) Behavior During Therapy: Flat affect Overall Cognitive Status: Within Functional Limits for tasks assessed                                           General Comments General comments (skin integrity, edema, etc.): HR increased from low 100s to 150s max observed while seated EOB.  RN informed, and back down to low 100s once back to supine.       Pertinent Vitals/Pain Pain Assessment: No/denies pain  PT Goals (current goals can now be found in the care plan section) Acute Rehab PT Goals Patient Stated Goal: to go home Progress towards PT goals: Progressing toward goals    Frequency    Min 3X/week      PT Plan Current plan remains appropriate       AM-PAC PT "6 Clicks" Daily Activity  Outcome Measure  Difficulty turning over in bed (including adjusting bedclothes, sheets and blankets)?: Unable Difficulty moving from lying on back to sitting on the side of the bed? : Unable Difficulty sitting down on and standing up from a chair with arms (e.g., wheelchair, bedside commode, etc,.)?: Unable Help needed moving to and from a bed to chair (including a wheelchair)?: A Lot Help needed walking in hospital room?: A Lot Help needed climbing  3-5 steps with a railing? : Total 6 Click Score: 8    End of Session Equipment Utilized During Treatment: Oxygen Activity Tolerance: Other (comment)(by high HR 154) Patient left: with call bell/phone within reach;with family/visitor present Nurse Communication: Mobility status;Other (comment)(HR, concerns re: changes since last session.) PT Visit Diagnosis: Unsteadiness on feet (R26.81);Muscle weakness (generalized) (M62.81);BPPV;Dizziness and giddiness (R42)     Time: 0092-3300 PT Time Calculation (min) (ACUTE ONLY): 16 min  Charges:  $Therapeutic Activity: 8-22 mins          Ivonna Kinnick B. [place], [place], DPT [phone]            05/02/2018, 12:40 PM

## 2018-05-02 NOTE — Discharge Summary (Signed)
Name: MATTHIAS BOGUS MRN: [id number] DOB: 02-Jun-1941 77 y.o. PCP: Christain Sacramento, MD  Date of Admission: 04/29/2018 10:16 AM Date of Discharge:  Attending Physician: Annia Belt, MD  Discharge Diagnosis:  Principal Problem:   Acute vestibular syndrome Active Problems:   Nausea and vomiting   Atrial fibrillation (HCC)   Vertigo   Lung mass   Panlobular emphysema (HCC)   Nystagmus   Small cell lung cancer, right middle lobe [facility])  Discharge Medications: Allergies as of 05/15/2018   No Known Allergies     Medication List    STOP taking these medications   aspirin 325 MG tablet   ciprofloxacin 500 MG tablet Commonly known as:  CIPRO   fluticasone 44 MCG/ACT inhaler Commonly known as:  FLOVENT HFA   TOPAMAX 50 MG tablet Generic drug:  topiramate     TAKE these medications   albuterol 108 (90 Base) MCG/ACT inhaler Commonly known as:  PROVENTIL HFA;VENTOLIN HFA Inhale 2 puffs into the lungs every 4 (four) hours as needed for shortness of breath.   alum & mag hydroxide-simeth 200-200-20 MG/5ML suspension Commonly known as:  MAALOX/MYLANTA Take 15 mLs by mouth every 6 (six) hours as needed for indigestion or heartburn.   apixaban 5 MG Tabs tablet Commonly known as:  ELIQUIS Take 1 tablet (5 mg total) by mouth 2 (two) times daily.   calcium carbonate 500 MG chewable tablet Commonly known as:  TUMS - dosed in mg elemental calcium Chew 1-2 tablets (200-400 mg of elemental calcium total) by mouth 3 (three) times daily as needed for indigestion or heartburn.   clobetasol cream 0.05 % Commonly known as:  TEMOVATE Apply 1 application topically 2 (two) times daily as needed (for dry skin).   diltiazem 120 MG 24 hr capsule Commonly known as:  CARDIZEM CD Take 1 capsule (120 mg total) by mouth daily.   doxazosin 4 MG tablet Commonly known as:  CARDURA Take 4 mg by mouth at bedtime.   feeding supplement (ENSURE ENLIVE) Liqd Take 237 mLs by mouth 2 (two)  times daily between meals.   finasteride 5 MG tablet Commonly known as:  PROSCAR Take 5 mg by mouth at bedtime.   fluticasone 50 MCG/ACT nasal spray Commonly known as:  FLONASE Place 2 sprays into both nostrils daily.   meclizine 25 MG tablet Commonly known as:  ANTIVERT Take 1 tablet (25 mg total) by mouth 3 (three) times daily as needed for dizziness.   methocarbamol 500 MG tablet Commonly known as:  ROBAXIN Take 500 mg by mouth as needed for muscle spasms (Muscle spasms).   mouth rinse Liqd solution 15 mLs by Mouth Rinse route 2 (two) times daily.   nystatin 100000 UNIT/ML suspension Commonly known as:  MYCOSTATIN Take 5 mLs (500,000 Units total) by mouth 4 (four) times daily.   ondansetron 4 MG tablet Commonly known as:  ZOFRAN Take 4 mg by mouth daily as needed.   pantoprazole 40 MG tablet Commonly known as:  PROTONIX Take 1 tablet (40 mg total) by mouth daily.   polyethylene glycol packet Commonly known as:  MIRALAX / GLYCOLAX Take 17 g by mouth daily.   senna-docusate 8.6-50 MG tablet Commonly known as:  Senokot-S Take 1-2 tablets by mouth 2 (two) times daily as needed for mild constipation or moderate constipation.   triamcinolone ointment 0.1 % Commonly known as:  KENALOG Apply 1 application topically as needed (for dry skin).   umeclidinium-vilanterol 62.5-25 MCG/INH Aepb Commonly known as:  ANORO ELLIPTA Inhale 1 puff into the lungs daily. Start taking on:  05/16/2018       Disposition and follow-up:   Mr.Rasheed R Hamza was discharged from [facility] in Stable condition.  At the hospital follow up visit please address:  Vertigo: severe some improvement with IVIG  -Patient's symptoms treated with meclizine and PRN phenergan, later IVIG. Patient also treated with vestibular PT, perhaps this can be continued periodically on discharge, uptitrate medications as needed.  Newly diagnosed PAF:  Started on cardizem and eliquis with good  result  -continue to monitor and titrate rate control medicine as necessary for comfort  RUL lung mass   -comfort measures  2.  Labs / imaging needed at time of follow-up: none  3.  Pending labs/ test needing follow-up: none  Follow-up Appointments:   Hospital Course by problem list: Principal Problem:   Acute vestibular syndrome Active Problems:   Nausea and vomiting   Atrial fibrillation (HCC)   Vertigo   Lung mass   Panlobular emphysema (HCC)   Nystagmus   Small cell lung cancer, right middle lobe (HCC)   Mikail Goostree is a 77 yo with PMH of AAA (s/p stent in 2016), COPD,Asthma, DVT (2005), and BPH who presented with vertigo, nausea, and vomiting. He was admitted to the internal medicine teaching service for management. The specific problems addressed during admission are as follows:  Vertigo differential included bppv, neuritis, progressive vascular disease, ultimately paraneoplastic syndrome: The patient presented with a 3-4 day history of dizziness, nausea, and vomiting which were initially concerning for development of posterior stroke. Fortunately the patient's Head CT and Brain MRI were negative for acute stroke on admission. His symptoms of dizziness and nausea/vomiting significantly improved after vestibular physical therapy on HD#1, suggesting a diagnosis of BPPV. Unfortunately his symptoms of dizziness and associated nausea returned overnight on HD #2 and patient had ongoing, severe dizziness with small movements, horizontal nystagmus, increased nausea, and vomiting with PO intake. His nausea/vomiting responded well to PRN phenergan and zofran. His dizziness persisted in spite of treatment with meclizine TID and multiple sessions of vestibular PT. the patient developed a cough and a physical exam concerning for left lower lobe pneumonia.  It was concerning for aspiration pneumonia after witnessing him trying to swallow food.  Chest x-ray was performed which showed a right  upper lobe nodule as well as concern for the left lower lobe pneumonia.  This was followed by a chest CT which confirmed a right upper lobe lung mass and confirmed the left lower lobe pneumonia with atelectasis.  At this point there was concern for paraneoplastic syndrome that may be driving the patient's vertigo nystagmus.  Consulted our neurologist who agreed that this was a concern and recommended starting the patient on IVIG versus high-dose steroids.  We opted for IVIG as it seemed to the safer option given the patient's age and comorbidities.  We had some mild improvement with the IVIG and physical therapy, unfortunately the nystagmus remained.  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  The treatment options were discussed with the family and the patient and ultimately the patient opted for comfort care measures in lieu of chemotherapy.    RUL lung mass with R hilar lymph node enlargement concerning for malignancy: Incidental finding when patient was being worked up for his left lower lobe pneumonia.  We consulted interventional radiology as the mass was very peripheral and they agree that they  would be able to get a biopsy sample without much difficulty.  The biopsy was taken, we were awaiting the results in the meantime we started IVIG for possible paraneoplastic syndrome. 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  The treatment options were discussed with the family and the patient and ultimately the patient opted for comfort care measures in lieu of chemotherapy.   New onset Atrial Fibrillation: Patient developed new onset A-Fib while working with physical therapy the afternoon of 05/01/2018. Prior to development of A-Fib, the patient had no prior history of arrhythmia and patient's HR stably in the 50s. After development of A-Fib patient's HR remained in 80s-90s at rest, but sometimes elevated to >100s with exertion. Patient's TSH  and electrolytes were within normal limits. Echocardiography showed no structural abnormalities to explain new-onset atrial fibrillation. He was started on low dose Cardizem 120 mg daily for rate control during hospitalization. Since his CHADS-VASC score was 4 the patient was started on Elqiuis for anticoagulation. Both of these medications were tolerated well during hospitalization, he converted to sinus rhythm and they were continued on discharge.  COPD/Asthma: No signs or symptoms of exacerbation during hospitalization. Patient was tranistioned to anoro for maintenance therapy.   Discharge Vitals:   BP 139/81 (BP Location: Left Arm)   Pulse 83   Temp 98.4 F (36.9 C) (Oral)   Resp 18   Ht 5\' 10"  (1.778 m)   Wt 210 lb 12.2 oz (95.6 kg)   SpO2 96%   BMI 30.24 kg/m   Pertinent Labs, Studies, and Procedures:  BMP Latest Ref Rng & Units 05/15/2018 05/14/2018 05/13/2018  Glucose 65 - 99 mg/dL 107(H) 116(H) 108(H)  BUN 6 - 20 mg/dL 19 23(H) 24(H)  Creatinine 0.61 - 1.24 mg/dL 1.06 1.11 1.31(H)  Sodium 135 - 145 mmol/L 136 137 137  Potassium 3.5 - 5.1 mmol/L 4.1 3.7 4.2  Chloride 101 - 111 mmol/L 104 108 104  CO2 22 - 32 mmol/L 24 25 29   Calcium 8.9 - 10.3 mg/dL 8.7(L) 8.4(L) 8.9   CBC Latest Ref Rng & Units 05/15/2018 05/12/2018 05/10/2018  WBC 4.0 - 10.5 K/uL 8.4 7.6 7.3  Hemoglobin 13.0 - 17.0 g/dL 13.3 14.8 15.0  Hematocrit 39.0 - 52.0 % 37.3(L) 41.2 42.4  Platelets 150 - 400 K/uL 190 212 197   Urinalysis, 04/29/2018    Component Value Date/Time   COLORURINE YELLOW 04/29/2018 [place] 04/29/2018 1544   LABSPEC 1.021 04/29/2018 1544   PHURINE 6.0 04/29/2018 1544   GLUCOSEU NEGATIVE 04/29/2018 1544   HGBUR MODERATE (A) 04/29/2018 1544   BILIRUBINUR NEGATIVE 04/29/2018 1544   KETONESUR 5 (A) 04/29/2018 1544   PROTEINUR 30 (A) 04/29/2018 1544   UROBILINOGEN 0.2 01/20/2015 0950   NITRITE NEGATIVE 04/29/2018 1544   LEUKOCYTESUR TRACE (A) 04/29/2018 1544   Magnesium  = 2.3 TSH = 0.462  Head CT w/o contrast, 04/29/2018 FINDINGS: Brain: Generalized brain atrophy. Extensive chronic small-vessel ischemic changes throughout the cerebral hemispheric white matter. 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 No sign of acute infarction, mass lesion, hemorrhage, hydrocephalus or extra-axial collection. Findings are slightly progressive since 2013.  Vascular: There is atherosclerotic calcification of the major vessels at the base of the brain.  Skull: Negative  Sinuses/Orbits: Clear/normal  Other: None  IMPRESSION: No acute finding by CT. Advanced chronic appearing small vessel ischemic changes of the cerebral hemispheric white matter, somewhat progressive since 2013. 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  Brain MRI: IMPRESSION: 1. No acute intracranial process. 2. 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 3. Severe chronic small vessel ischemic changes. Old small cerebellar infarcts.  CLINICAL DATA:  Mass in the right lung seen on chest x-ray.  EXAM: CT CHEST WITH CONTRAST  TECHNIQUE: Multidetector CT imaging of the chest was performed during intravenous contrast administration.  CONTRAST:  139mL OMNIPAQUE IOHEXOL 300 MG/ML  SOLN  COMPARISON:  None.  FINDINGS: Cardiovascular: The heart size is normal. Atherosclerotic changes seen in the thoracic aorta without dissection. A gentle bulge along the arch on image 62 is identified. No gross aneurysm. Central pulmonary arteries are normal in caliber with no obvious filling defects.  Mediastinum/Nodes: The thyroid and esophagus are normal. An enlarged node is seen in the precarinal region with a short axis measurement of 2.7 cm. A mildly prominent right hilar node is seen on image 89 measuring 13 mm in short axis. No other definitive adenopathy. No effusions.  Lungs/Pleura: Central airways are normal. No pneumothorax. There  is patchy opacity in left base. While there is certainly a component of atelectasis, I suspect infiltrate as well. There is a mass in the periphery of the right upper lobe posteriorly abutting the pleura measuring 5.2 by 2.7 cm. No definitive invasion into the adjacent chest wall. No other nodules or masses. Severe emphysematous changes in the lungs.  Upper Abdomen: A small nodule posterior to the spleen may represent a splenule. There is a cyst in the upper pole of the right kidney. Adrenal glands are normal. No other acute abnormalities.  Musculoskeletal: No evidence of bony metastatic disease.  IMPRESSION: 1. There is a mass in the periphery of the right upper lobe posteriorly measuring 5.2 x 2.7 cm abutting the pleura without identified chest wall invasion. This mass is concerning for malignancy. There is 1 abnormal node in the mediastinum as well as a borderline node in the right hilum. I suspect at least 1 of these nodes is likely metastatic. Recommend a PET-CT for evaluation of the lung mass and lymph nodes. The mass will likely ultimately require tissue sampling for diagnosis. 2. Probable pneumonia in the left lung base. There is also a component of atelectasis. Recommend follow-up to resolution. 3. Atherosclerotic changes in the thoracic aorta. 4. Emphysematous changes in the lungs.  Aortic Atherosclerosis (ICD10-I70.0) and Emphysema (ICD10-J43.9).   Electronically Signed   By: Dorise Bullion III M.D   On: 05/03/2018 18:00  Lung biopsy Diagnosis Lung, needle/core biopsy(ies), right upper lobe - SMALL CELL CARCINOMA. - SEE COMMENT.  Discharge Instructions: Discharge Instructions    Diet - low sodium heart healthy   Complete by:  As directed    Discharge instructions   Complete by:  As directed    Please help Mr. Tews with comfort care measures, ensure he is pain free, eats and drinks what he wants and gets quality time with his family.   Increase  activity slowly   Complete by:  As directed       Signed: [name], MD 05/15/2018, 11:14 AM

## 2018-05-02 NOTE — Progress Notes (Addendum)
Subjective: Patient seen laying in bed this AM with emesis bag in his hand. States he had one additional episode of emesis this AM with ongoing dizziness. Patient anxious to work with PT today. Patient able to eat a little breakfast this AM and getting some relief from PRN phenergan. Denies chest pain, cough, SOB.   Objective:  Vital signs in last 24 hours: Vitals:   05/01/18 1545 05/01/18 2044 05/02/18 0608 05/02/18 0608  BP: (!) 172/98 (!) 148/89 138/86 138/86  Pulse:  80 93 93  Resp:  18 18 18   Temp:  98.6 F (37 C) 98.3 F (36.8 C) 98.3 F (36.8 C)  TempSrc:  Oral Oral   SpO2:  92% 92% 92%  Weight:      Height:       Physical Exam  Constitutional:  Appears older than stated age, laying still in bed @ 20deg angle. Non diaphoretic and in no acute distress.  Eyes: Conjunctivae are normal. No scleral icterus.  Significant, intermittent horizontal nystagmus, worsened from previous exams.  Cardiovascular: Normal rate, regular rhythm and intact distal pulses. Exam reveals no friction rub.  No murmur heard. Respiratory:  Lungs clear to auscultation without crackles or wheezing in anterior lung fields.  Musculoskeletal: He exhibits no edema (of bilateral lower extremities) or tenderness (of bilateral lower extremities).  Neurological: He is alert.  Face strength and sensation intact bilaterally. Tongue midline. Gross motor and sensation to light touch of upper and lower extremities intact bilaterally.  Skin: Skin is warm and dry. No rash noted. No erythema.   Assessment/Plan:  Principal Problem:   Acute vestibular syndrome Active Problems:   Nausea and vomiting   Atrial fibrillation (HCC)  Reginald [name] is a 77 yo with PMH of AAA (s/p stent in 2016), COPD, Asthma, DVT (2005), and BPH who presented with vertigo, nausea, and vomiting. He was admitted to the internal medicine teaching service for management. 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 Will continue vestibular PT today, since yesterday's planned sessions were interrupted with development of new-onset asymptomatic A-Fib. Will consider ENT referral tomorrow for additional recommendations if not improved with vestibular PT today. Could also consider scopolamine patch for symptoms if patient has ongoing inability to tolerate PO on current regimen, however this medication would likely exacerbate underlying BPH which is already under good control.  -Vestibular PT consulted, their assistance and recommendations appreciated -Meclizine 25 mg TID -Phenergan PRN for nausea -Advance diet as tolerated -Will consider repeat head imaging tomorrow if patient has not clinically improved, to evaluate for new stroke. If patient gets repeat imaging would consider CN VIII/vestibular MRI as this may be useful to evaluate this system in the setting of ongoing vertigo no-longer responding to vestibular PT.  -SW consulted for SNF placement  New onset Atrial Fibrillation: Patient developed new onset A-Fib yesterday afternoon (around 2:00pm) while working with vestibular PT. Prior to A-Fib patient's HR stably in the 50s. Patient currently remains in A-Fib with rates stable in the 80s-90s at rest. TSH and blood work within normal limits. CHADS-VASC = 3 (age, hx of vascular disease AAA with stent replacement) and will therefore need anticoagulation on discharge.  -Cardiology consulted, recommendations appreciated -PRN metoprolol for HR >140 -F/u echocardiography  COPD/Asthma: No current signs or symptoms of exacerbation, although nursing  noted green phlegm in mouth this AM patient and son who are bedside denied symptoms of cough. Will need to continue to closely monitor and start treatment for  COPD exacerbation if symptoms arise. Will continue on home fluticasone and albuterol PRN for now.   BPH: Chronic, stable. Continue home doxazosin and finasteride daily  FEN/GI: -CLD, advance to regular as tolerated -No IVF, IV K+ replacement today (40 mEq)  VTE Prophylaxis: Lovenox daily Code Status: Full  Dispo: Anticipated discharge in approximately 1-2 day(s).   Reginald Ripple, MD 05/02/2018, 9:56 AM Pager: [phone]

## 2018-05-02 NOTE — Progress Notes (Signed)
Informed Dr. Berneice Gandy that per PT when they started working with patient he had new onset bilateral horizontal nystagmus as well and less coordination in right arm but with equal strength in both. Per MD will continue to monitor patient and will be by to assess patient later on.

## 2018-05-02 NOTE — Progress Notes (Signed)
Pt having more greenish white secretions now. Suctioned pt. Nurse able to clear pt's throat by oral suctioning pt. Ranelle Oyster, RN

## 2018-05-02 NOTE — Progress Notes (Signed)
  Date: 05/02/2018  Patient name: Reginald [name]  Medical record number: [id number]  Date of birth: 08-27-1941   I have seen and evaluated this patient and I have discussed the plan of care with the house staff. Please see their note for complete details. I concur with their findings with the following additions/corrections:   Unfortunately, he is worse again today.  Still having nausea and vertigo, and threw up again this morning.  He reports he again got some relief yesterday by working with PT and doing the Epley maneuver, but not as complete as the first time.  This morning, he has prominent horizontal nystagmus that appears to be triggered by any head movement.  At this point, difficult to assess what to do.  The fact that he responded so completely to the Epley maneuver the first time strongly suggests that this is truly BPPV, as no other etiologies would respond in this way.  However, the vast majority of patients improve after the first 1-2 maneuvers.  The resumption of his symptoms and the worsening vertigo this morning make me concerned that there may be an additional etiology.  It appears PT did not attempt an Epley maneuver with him today.  My plan was to attempt 1 more Epley maneuver and determine his response.  At this point, it probably makes sense to consult ENT or neurology for assistance with further evaluation.  We will continue meclizine and promethazine.  As noted yesterday, he has new onset atrial fibrillation.  We have consulted cardiology for discussion of rate control versus possible cardioversion.  Appreciate cardiology seeing him today, they are recommending a trial of diltiazem with the understanding that he had sinus bradycardia prior to the atrial fibrillation and we risk making his bradycardia worse.  We will watch him closely on telemetry and continue work with cardiology on plan for his atrial fibrillation.  Agree with initiating anticoagulation given his risk of  stroke.  Lenice Pressman, M.D., Ph.D. 05/02/2018, 5:03 PM

## 2018-05-03 ENCOUNTER — Inpatient Hospital Stay (HOSPITAL_COMMUNITY): Payer: Medicare Other

## 2018-05-03 DIAGNOSIS — I340 Nonrheumatic mitral (valve) insufficiency: Secondary | ICD-10-CM

## 2018-05-03 LAB — ECHOCARDIOGRAM COMPLETE
Height: 70 in
Weight: 3372.16 oz

## 2018-05-03 LAB — MRSA PCR SCREENING: MRSA by PCR: NEGATIVE

## 2018-05-03 MED ORDER — DEXTROSE IN LACTATED RINGERS 5 % IV SOLN
INTRAVENOUS | Status: DC
  Administered 2018-05-03: 12:00:00 via INTRAVENOUS

## 2018-05-03 MED ORDER — MECLIZINE HCL 25 MG PO TABS
25.0000 mg | ORAL_TABLET | Freq: Three times a day (TID) | ORAL | Status: DC | PRN
  Administered 2018-05-04 – 2018-05-13 (×5): 25 mg via ORAL
  Filled 2018-05-03 (×6): qty 1

## 2018-05-03 MED ORDER — SODIUM CHLORIDE 0.9 % IV SOLN
3.0000 g | Freq: Four times a day (QID) | INTRAVENOUS | Status: AC
  Administered 2018-05-03 – 2018-05-10 (×26): 3 g via INTRAVENOUS
  Filled 2018-05-03 (×31): qty 3

## 2018-05-03 MED ORDER — IOHEXOL 300 MG/ML  SOLN
75.0000 mL | Freq: Once | INTRAMUSCULAR | Status: AC | PRN
  Administered 2018-05-03: 100 mL via INTRAVENOUS

## 2018-05-03 NOTE — Progress Notes (Signed)
  Date: 05/03/2018  Patient name: Reginald [name]  Medical record number: [id number]  Date of birth: 22-Sep-1941   This patient's plan of care was discussed with the house staff. Please see Dr. Maudie Flakes note for complete details. I concur with his findings.   Sid Falcon, MD 05/03/2018, 11:56 AM

## 2018-05-03 NOTE — Progress Notes (Signed)
CSW following for discharge plan. Patient has bed available at [facility] for SNF. Per MD, not medically stable for discharge today.  CSW to follow.  Laveda Abbe, [place] Clinical Social Worker [phone]

## 2018-05-03 NOTE — Progress Notes (Signed)
Patient desat during the night 84%. Patient was seen by Resp Therapy. Coughing and deep breathing was encouraged. Patient was increased to 3 liters temporarily. He rebounded to 86-88%. MD Johny Chess was notified.  Both MD Johny Chess and RT were ok with patient remaining between 86-88% on 2 liters.

## 2018-05-03 NOTE — Discharge Instructions (Signed)
Information on my medicine - ELIQUIS (apixaban)  This medication education was reviewed with me or my healthcare representative as part of my discharge preparation.  The pharmacist that spoke with me during my hospital stay was:  Orma Render, [place]  Why was Eliquis prescribed for you? Eliquis was prescribed for you to reduce the risk of forming blood clots that can cause a stroke if you have a medical condition called atrial fibrillation (a type of irregular heartbeat) OR to reduce the risk of a blood clots forming after orthopedic surgery.  What do You need to know about Eliquis ? Take your Eliquis TWICE DAILY - one tablet in the morning and one tablet in the evening with or without food.  It would be best to take the doses about the same time each day.  If you have difficulty swallowing the tablet whole please discuss with your pharmacist how to take the medication safely.  Take Eliquis exactly as prescribed by your doctor and DO NOT stop taking Eliquis without talking to the doctor who prescribed the medication.  Stopping may increase your risk of developing a new clot or stroke.  Refill your prescription before you run out.  After discharge, you should have regular check-up appointments with your healthcare provider that is prescribing your Eliquis.  In the future your dose may need to be changed if your kidney function or weight changes by a significant amount or as you get older.  What do you do if you miss a dose? If you miss a dose, take it as soon as you remember on the same day and resume taking twice daily.  Do not take more than one dose of ELIQUIS at the same time.  Important Safety Information A possible side effect of Eliquis is bleeding. You should call your healthcare provider right away if you experience any of the following: ? Bleeding from an injury or your nose that does not stop. ? Unusual colored urine (red or dark brown) or unusual colored stools (red or  black). ? Unusual bruising for unknown reasons. ? A serious fall or if you hit your head (even if there is no bleeding).  Some medicines may interact with Eliquis and might increase your risk of bleeding or clotting while on Eliquis. To help avoid this, consult your healthcare provider or pharmacist prior to using any new prescription or non-prescription medications, including herbals, vitamins, non-steroidal anti-inflammatory drugs (NSAIDs) and supplements.  This website has more information on Eliquis (apixaban): [email or web address].

## 2018-05-03 NOTE — Evaluation (Signed)
Clinical/Bedside Swallow Evaluation Patient Details  Name: Reginald [name] MRN: [id number] Date of Birth: [birth year]  Today's Date: 05/03/2018 Time: SLP Start Time (ACUTE ONLY): 1450 SLP Stop Time (ACUTE ONLY): 1512 SLP Time Calculation (min) (ACUTE ONLY): 22 min  Past Medical History:  Past Medical History:  Diagnosis Date  . AAA (abdominal aortic aneurysm) ([place])   . Arthritis   . Asthma   . COPD (chronic obstructive pulmonary disease) ([place])   . DVT (deep venous thrombosis) ([place]) 2005   Right  leg  . Headache    cluster headaches  . Hyperlipidemia   . Joint pain   . Shortness of breath dyspnea    exertion  . Vertigo 04/29/2018   Past Surgical History:  Past Surgical History:  Procedure Laterality Date  . ABDOMINAL AORTIC ENDOVASCULAR STENT GRAFT N/A 01/26/2015   Procedure: ABDOMINAL AORTIC ENDOVASCULAR STENT GRAFT;  Surgeon: Elam Dutch, MD;  Location: [facility];  Service: Vascular;  Laterality: N/A;  . FRACTURE SURGERY  2005   Right heel - Fx and NO SURGERY   HPI:  Reginald [name] is a 77 yo with PMH of AAA (s/p stent in 2016), COPD, Asthma, DVT (2005), and BPH who presented with vertigo, nausea, and vomiting. MD noting concern for aspiration PNA, lungs with rhonchi and productive cough. 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 F/u chest CT results pending. MRI 04/29/18 with no acute findings, old cerebellar infarcts. Repeat imaging is being considered. Per PT notes, pt with new sudden onset opsiclonus-type eye movements, with ? medication side effect vs neurodegenerative etiology.   Assessment / Plan / Recommendation Clinical Impression   Patient presents with questionable signs of aspiration, with subtle, delayed coughing intermittently during PO trials which did not appear definitively suggestive of aspiration. Swallow response appears timely, though pt does appear to piecemeal liquids, taking 2 swallows when he takes larger sips.  He did have a congested cough at baseline prior to POs administered. Per chart review and pt report he does not have a history of dysphagia. Discussed with RN; recommend beginning a dys 3 (soft) diet with thin liquids, meds whole in puree under close supervision to monitor for signs of aspiration and pt's respiratory status. If pt coughing or exhibiting respiratory distress with POs, recommend he be made NPO and SLP paged. SLP will follow for tolerance and to determine possible need for instrumental assessment of swallow function.    SLP Visit Diagnosis: Dysphagia, unspecified (R13.10)    Aspiration Risk  Mild aspiration risk    Diet Recommendation Dysphagia 3 (Mech soft);Thin liquid   Liquid Administration via: Cup;Straw Medication Administration: Whole meds with puree Supervision: Full supervision/cueing for compensatory strategies(to monitor respiratory status) Compensations: Slow rate;Small sips/bites    Other  Recommendations Oral Care Recommendations: Oral care BID   Follow up Recommendations Other (comment)(tbd)      Frequency and Duration min 2x/week  2 weeks       Prognosis Prognosis for Safe Diet Advancement: Good      Swallow Study   General Date of Onset: 04/29/18 HPI: Reginald [name] is a 77 yo with PMH of AAA (s/p stent in 2016), COPD, Asthma, DVT (2005), and BPH who presented with vertigo, nausea, and vomiting. MD noting concern for aspiration PNA, lungs with rhonchi and productive cough. 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 F/u chest CT results pending. MRI 04/29/18 with no acute findings, old cerebellar infarcts. Repeat imaging  is being considered. Per PT notes, pt with new sudden onset opsiclonus-type eye movements, with ? medication side effect vs neurodegenerative etiology. Type of Study: Bedside Swallow Evaluation Previous Swallow Assessment: none on file Diet Prior to this Study: NPO Temperature Spikes Noted:  No Respiratory Status: Room air History of Recent Intubation: No Behavior/Cognition: Alert;Cooperative;Confused Oral Cavity Assessment: Within Functional Limits Oral Care Completed by SLP: Yes Oral Cavity - Dentition: Dentures, top;Dentures, bottom Vision: Functional for self-feeding Self-Feeding Abilities: Able to feed self Patient Positioning: Upright in bed Baseline Vocal Quality: Normal Volitional Cough: Weak Volitional Swallow: Able to elicit    Oral/Motor/Sensory Function Overall Oral Motor/Sensory Function: Mild impairment(mildly weak labial seal) Facial ROM: Within Functional Limits Facial Symmetry: Within Functional Limits Lingual ROM: Within Functional Limits Lingual Symmetry: Within Functional Limits   Ice Chips Ice chips: Within functional limits   Thin Liquid Thin Liquid: Impaired Presentation: Cup;Straw Oral Phase Functional Implications: (appears to be piecemealing liquids) Pharyngeal  Phase Impairments: Cough - Delayed    Nectar Thick Nectar Thick Liquid: Not tested   Honey Thick Honey Thick Liquid: Not tested   Puree Puree: Within functional limits Presentation: Spoon   Solid   GO   Solid: Impaired Presentation: Self Fed Oral Phase Impairments: Impaired mastication Oral Phase Functional Implications: Oral residue(minimal residue) Pharyngeal Phase Impairments: Cough - Delayed       Deneise Lever, [place], CCC-SLP Speech-Language Pathologist [phone]  Aliene Altes 05/03/2018,5:40 PM

## 2018-05-03 NOTE — Progress Notes (Signed)
Pharmacy Antibiotic Note  Reginald [name] is a 77 y.o. male admitted on 04/29/2018 with aspiration pneumonia.  Pharmacy has been consulted for Unasyn dosing.  Plan: Unasyn 3 grams every 6 hours  F/U LOT, cultures, clinical progress  Height: 5\' 10"  (177.8 cm) Weight: 210 lb 12.2 oz (95.6 kg) IBW/kg (Calculated) : 73  Temp (24hrs), Avg:98.4 F (36.9 C), Min:98 F (36.7 C), Max:98.7 F (37.1 C)  Recent Labs  Lab 04/29/18 1052 04/30/18 0416 05/02/18 0533  WBC 7.8 9.5  --   CREATININE 1.23 1.14 1.09    Estimated Creatinine Clearance: 65.8 mL/min (by C-G formula based on SCr of 1.09 mg/dL).    No Known Allergies  Antimicrobials this admission: Unasyn 6/1 >>  Thank you for allowing pharmacy to be a part of this patient's care.  Angus Seller, PharmD Pharmacy Resident Clinical Phone for 05/03/2018 until 3:30pm: [phone] If after 3:30pm, please call main pharmacy at [phone] 05/03/2018 11:53 AM

## 2018-05-03 NOTE — Progress Notes (Signed)
Progress Note  Patient Name: Reginald [name] Date of Encounter: 05/03/2018  Primary Cardiologist: No primary care provider on file.   Subjective   No longer in atrial fibrillation., no CP, no change in SOB  Inpatient Medications    Scheduled Meds: . apixaban  5 mg Oral BID  . budesonide (PULMICORT) nebulizer solution  0.25 mg Nebulization Daily  . diltiazem  120 mg Oral Daily  . doxazosin  4 mg Oral QHS  . finasteride  5 mg Oral QHS  . mouth rinse  15 mL Mouth Rinse BID   Continuous Infusions: . ampicillin-sulbactam (UNASYN) IV 3 g (05/03/18 1219)  . dextrose 5% lactated ringers 50 mL/hr at 05/03/18 1219   PRN Meds: acetaminophen **OR** acetaminophen, meclizine, metoprolol tartrate, ondansetron (ZOFRAN) IV   Vital Signs    Vitals:   05/02/18 1400 05/02/18 2159 05/03/18 0514 05/03/18 0822  BP: 140/80 (!) 157/99 (!) 142/77   Pulse: 90 (!) 102 68   Resp: 18 18 16    Temp: 98 F (36.7 C) 98.7 F (37.1 C) 98.5 F (36.9 C)   TempSrc: Oral Oral Oral   SpO2: 95% 94% (!) 85% (!) 88%  Weight:      Height:        Intake/Output Summary (Last 24 hours) at 05/03/2018 1330 Last data filed at 05/03/2018 0517 Gross per 24 hour  Intake 400 ml  Output 900 ml  Net -500 ml   Filed Weights   04/29/18 1042 04/30/18 1512  Weight: 218 lb (98.9 kg) 210 lb 12.2 oz (95.6 kg)    Telemetry    Sinus rhythm with PACs currently- Personally Reviewed  ECG    AFIB - Personally Reviewed  Physical Exam   GEN: No acute distress.  Elderly  Neck: No JVD Cardiac:  Regular rate and rhythm with occasional ectopy, no murmurs, rubs, or gallops.  Respiratory: Clear to auscultation bilaterally. GI: Soft, nontender, non-distended  MS: No edema; No deformity. Neuro:  Nystagmus Psych: Normal affect   Labs    Chemistry Recent Labs  Lab 04/29/18 1052 04/30/18 0416 05/02/18 0533  NA 139 142 136  K 3.9 3.8 3.1*  CL 103 104 98*  CO2 25 26 26   GLUCOSE 140* 109* 124*  BUN 19 24* 22*    CREATININE 1.23 1.14 1.09  CALCIUM 9.5 9.3 9.1  PROT 7.4  --   --   ALBUMIN 3.8  --   --   AST 18  --   --   ALT 14*  --   --   ALKPHOS 57  --   --   BILITOT 1.9*  --   --   GFRNONAA 55* >60 >60  GFRAA >60 >60 >60  ANIONGAP 11 12 12      Hematology Recent Labs  Lab 04/29/18 1052 04/30/18 0416  WBC 7.8 9.5  RBC 4.49 4.25  HGB 15.0 14.5  HCT 42.8 41.4  MCV 95.3 97.4  MCH 33.4 34.1*  MCHC 35.0 35.0  RDW 12.5 12.7  PLT 207 216    Cardiac EnzymesNo results for input(s): TROPONINI in the last 168 hours.  Recent Labs  Lab 04/29/18 1743  TROPIPOC 0.00     BNPNo results for input(s): BNP, PROBNP in the last 168 hours.   DDimer No results for input(s): DDIMER in the last 168 hours.   Radiology    Dg Chest Port 1 View  Result Date: 05/03/2018 CLINICAL DATA:  77 year old male with rhonchi and cough EXAM: PORTABLE CHEST 1  VIEW COMPARISON:  Prior chest x-ray 01/26/2015 FINDINGS: Interval development of a 3.7 cm nodular opacity overlying the right upper lung. Stable borderline cardiomegaly. Bilateral bronchitic changes and bibasilar interstitial prominence are similar compared to prior. Atherosclerotic calcifications are present along the transverse aorta. No evidence of pleural effusion or pneumothorax. Upper lung predominant hyperlucency consistent with underlying emphysema. No acute osseous abnormality. Incompletely imaged aortic endograft. IMPRESSION: 1. Approximately 3.7 cm masslike opacity overlying the right upper lung concerning for an underlying bronchogenic carcinoma. A region of masslike consolidation in the setting of pneumonia could appear similar. Recommend further evaluation with CT scan of the chest. 2. Stable cardiomegaly. 3. Stable chronic emphysematous, bronchitic and interstitial changes consistent with the clinical history of COPD. 4.  Aortic Atherosclerosis (ICD10-170.0) Electronically Signed   By: Jacqulynn Cadet M.D.   On: 05/03/2018 09:27    Cardiac Studies    ECHO P  Patient Profile     77 y.o. male with persistent atrial fibrillation, vertigo, AAA endovascular repair  Assessment & Plan    Paroxysmal atrial fibrillation -Converted sinus rhythm sinus tachycardia with PACs -Low-dose diltiazem CD 120 mg a day.  Continue -Eliquis stroke protection -Echo pending.  Vertigo per main team  We will go ahead and sign off.  Please let us know we can be of further assistance.  For questions or updates, please contact [place] Please consult [email or web address] for contact info under Cardiology/STEMI.      Signed, Candee Furbish, MD  05/03/2018, 1:30 PM

## 2018-05-03 NOTE — Progress Notes (Signed)
  Echocardiogram 2D Echocardiogram has been performed.  Reginald [name] 05/03/2018, 11:37 AM

## 2018-05-03 NOTE — Progress Notes (Signed)
    Subjective: Patient still feeling miserable, dizziness worse when sitting up.  No chest pain, vomiting less, mouth is dry.  Does not perceive feeling short of breath but O2 saturation noticeably lower.    Objective:  Vital signs in last 24 hours: Vitals:   05/02/18 1400 05/02/18 2159 05/03/18 0514 05/03/18 0822  BP: 140/80 (!) 157/99 (!) 142/77   Pulse: 90 (!) 102 68   Resp: 18 18 16    Temp: 98 F (36.7 C) 98.7 F (37.1 C) 98.5 F (36.9 C)   TempSrc: Oral Oral Oral   SpO2: 95% 94% (!) 85% (!) 88%  Weight:      Height:       Physical Exam  Constitutional: He appears well-developed and well-nourished.  Eyes: Right eye exhibits no discharge. Left eye exhibits no discharge. No scleral icterus.  Cardiovascular: Normal rate, normal heart sounds and intact distal pulses. An irregularly irregular rhythm present. Exam reveals no gallop and no friction rub.  No murmur heard. Pulmonary/Chest: Effort normal. No respiratory distress. He has no wheezes. He has rhonchi (heard throughout). He has no rales.  Abdominal: Soft. Bowel sounds are normal. He exhibits no distension and no mass. There is no tenderness. There is no guarding.  Musculoskeletal: He exhibits no edema (no LE edema).  Neurological: He is alert.  Horizontal nystagmus seen, worse with leaning forward. Left finger to nose dysmetria Gets dizzy and nauseous when leaning forward feels like he is falling    Assessment/Plan:  Principal Problem:   Acute vestibular syndrome Active Problems:   Nausea and vomiting   Atrial fibrillation (HCC)  Reginald [name] is a 77 yo with PMH of AAA (s/p stent in 2016), COPD, Asthma, DVT (2005), and BPH who presented with vertigo, nausea, and vomiting. He was admitted to the internal medicine teaching service for management. 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 Will continue vestibular PT cannot see pt again until Monday.  -Vestibular PT  -Meclizine 25 mg TID -zofran PRN for nausea -SLP eval -Will speak with radiologist today and consider repeat head imaging -SW consulted for SNF placement -Will consider ENT consult but will be difficult on the weekend  New onset Atrial Fibrillation: Patient developed new onset A-Fib  while working with vestibular PT. Prior to A-Fib patient's HR  in the 50s. Patient currently remains in A-Fib with rates stable in the 80s-90s at rest. TSH and blood work within normal limits. Pt started on eliquis  -Continue eliquis -PRN metoprolol for HR >140 -ECHO scheduled for today  COPD/Asthma: Lungs with rhonchi today, productive cough, I am concerned for aspiration pneumonia no wheezing appreciated.  Noted to have increased oxygen requirement overnight.  -chest x-ray -SLP eval  BPH: Chronic, stable. Continue home doxazosin and finasteride daily    VTE Prophylaxis: Lovenox daily Code Status: Full  Dispo: Anticipated discharge in approximately 2-3 day(s).   [name], MD 05/03/2018, 8:36 AM [name] MD PGY-1 Internal Medicine Pager # [phone]

## 2018-05-04 DIAGNOSIS — Z7901 Long term (current) use of anticoagulants: Secondary | ICD-10-CM

## 2018-05-04 DIAGNOSIS — Z95818 Presence of other cardiac implants and grafts: Secondary | ICD-10-CM

## 2018-05-04 DIAGNOSIS — R1310 Dysphagia, unspecified: Secondary | ICD-10-CM

## 2018-05-04 DIAGNOSIS — J690 Pneumonitis due to inhalation of food and vomit: Secondary | ICD-10-CM

## 2018-05-04 DIAGNOSIS — R42 Dizziness and giddiness: Secondary | ICD-10-CM

## 2018-05-04 LAB — CBC WITH DIFFERENTIAL/PLATELET
Abs Immature Granulocytes: 0.1 10*3/uL (ref 0.0–0.1)
Basophils Absolute: 0 10*3/uL (ref 0.0–0.1)
Basophils Relative: 0 %
Eosinophils Absolute: 0 10*3/uL (ref 0.0–0.7)
Eosinophils Relative: 0 %
HCT: 45.8 % (ref 39.0–52.0)
Hemoglobin: 15.8 g/dL (ref 13.0–17.0)
Immature Granulocytes: 1 %
Lymphocytes Relative: 6 %
Lymphs Abs: 0.7 10*3/uL (ref 0.7–4.0)
MCH: 33.2 pg (ref 26.0–34.0)
MCHC: 34.5 g/dL (ref 30.0–36.0)
MCV: 96.2 fL (ref 78.0–100.0)
Monocytes Absolute: 0.8 10*3/uL (ref 0.1–1.0)
Monocytes Relative: 8 %
Neutro Abs: 9.5 10*3/uL — ABNORMAL HIGH (ref 1.7–7.7)
Neutrophils Relative %: 85 %
Platelets: 195 10*3/uL (ref 150–400)
RBC: 4.76 MIL/uL (ref 4.22–5.81)
RDW: 12.5 % (ref 11.5–15.5)
WBC: 11.1 10*3/uL — ABNORMAL HIGH (ref 4.0–10.5)

## 2018-05-04 LAB — RENAL FUNCTION PANEL
Albumin: 3 g/dL — ABNORMAL LOW (ref 3.5–5.0)
Anion gap: 8 (ref 5–15)
BUN: 21 mg/dL — ABNORMAL HIGH (ref 6–20)
CO2: 26 mmol/L (ref 22–32)
Calcium: 8.8 mg/dL — ABNORMAL LOW (ref 8.9–10.3)
Chloride: 107 mmol/L (ref 101–111)
Creatinine, Ser: 1.05 mg/dL (ref 0.61–1.24)
GFR calc Af Amer: >60 mL/min (ref >60)
GFR calc non Af Amer: >60 mL/min (ref >60)
Glucose, Bld: 147 mg/dL — ABNORMAL HIGH (ref 65–99)
Phosphorus: 2.3 mg/dL — ABNORMAL LOW (ref 2.5–4.6)
Potassium: 3 mmol/L — ABNORMAL LOW (ref 3.5–5.1)
Sodium: 141 mmol/L (ref 135–145)

## 2018-05-04 MED ORDER — ENSURE ENLIVE PO LIQD
237.0000 mL | Freq: Two times a day (BID) | ORAL | Status: DC
  Administered 2018-05-04 – 2018-05-14 (×15): 237 mL via ORAL

## 2018-05-04 MED ORDER — POTASSIUM & SODIUM PHOSPHATES 280-160-250 MG PO PACK
1.0000 | PACK | Freq: Three times a day (TID) | ORAL | Status: AC
  Administered 2018-05-04 (×3): 1 via ORAL
  Filled 2018-05-04 (×3): qty 1

## 2018-05-04 MED ORDER — POTASSIUM PHOSPHATE NICU ORAL SYRINGE 4.4 MEQ/ML: 30.0000 meq | Freq: Two times a day (BID) | ORAL | Status: DC

## 2018-05-04 MED ORDER — IPRATROPIUM-ALBUTEROL 0.5-2.5 (3) MG/3ML IN SOLN: 3.0000 mL | Freq: Three times a day (TID) | RESPIRATORY_TRACT | Status: DC | PRN

## 2018-05-04 NOTE — Progress Notes (Signed)
Subjective: Patient still feeling poorly, nystagmus seems to be worsening.  No chest pain, vomiting less per pt, mouth is still dry.      Objective:  Vital signs in last 24 hours: Vitals:   05/03/18 1445 05/03/18 2210 05/04/18 0534 05/04/18 0700  BP: (!) 154/75 (!) 133/57 (!) 155/85   Pulse: 73 73 87   Resp: 16  14   Temp: 98.4 F (36.9 C) 97.8 F (36.6 C) (!) 97.2 F (36.2 C) 97.8 F (36.6 C)  TempSrc:  Oral Oral Oral  SpO2: 94% 95% 94%   Weight:      Height:       Physical Exam  Constitutional: He appears well-developed and well-nourished.  Eyes: Right eye exhibits no discharge. Left eye exhibits no discharge. No scleral icterus.  Cardiovascular: Normal rate, regular rhythm, normal heart sounds and intact distal pulses. Exam reveals no gallop and no friction rub.  No murmur heard. Pulmonary/Chest: Effort normal. No respiratory distress. He has decreased breath sounds in the left middle field and the left lower field. He has no wheezes. He has rhonchi in the right middle field and the right lower field. He has no rales.  Abdominal: Soft. Bowel sounds are normal. He exhibits no distension and no mass. There is no tenderness. There is no guarding.  Musculoskeletal: He exhibits no edema (no LE edema).  Neurological: He is alert.  Horizontal nystagmus seen, worse with leaning forward. Left finger to nose dysmetria Gets dizzy and nauseous when leaning forward feels like he is falling    Assessment/Plan:  Principal Problem:   Acute vestibular syndrome Active Problems:   Nausea and vomiting   Atrial fibrillation (HCC)  Blease Capaldi is a 77 yo with PMH of AAA (s/p stent in 2016), COPD, Asthma, DVT (2005), and BPH who presented with vertigo, nausea, and vomiting. He was admitted to the internal medicine teaching service for management. 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 Will continue vestibular PT cannot see pt again until 6/3.  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 Prior to A-Fib patient's HR  in the 50s. Patient currently remains in A-Fib with rates stable in the 80s-90s at rest. TSH and blood work within normal limits. Pt started on eliquis  -NSR today -Continue eliquis -diltiazem 24hr 120mg  daily -PRN metoprolol for HR >140 -ECHO normal EF, looks good for pts age  COPD/Asthma: Does not seem to be in exacerbation based on exam, O2 requirement is almost certainly due to pneumonia.  On rescue inhaler and flovent at home  -continue to monitor -duonebs PRN Q8  BPH: Chronic, stable. Continue home doxazosin and finasteride daily    VTE Prophylaxis: Lovenox daily Code Status: Full  Dispo: Anticipated discharge in approximately 2-3 day(s).   [name], MD 05/04/2018, 1:16 PM [name] MD PGY-1 Internal Medicine Pager # [phone]

## 2018-05-04 NOTE — Progress Notes (Signed)
Clinical Education officer, museum following for support and dishcrage need. CSW spoke to admission coordinator Ardelle Park at [facility]. Ardelle Park stated they are following patient and will be able to take him tomorrow if medically cleared by MD.    Rhea Pink, MSW,  [place]

## 2018-05-04 NOTE — Progress Notes (Signed)
  Date: 05/04/2018  Patient name: Reginald [name]  Medical record number: [id number]  Date of birth: November 03, 1941   I have seen and evaluated this patient and I have discussed the plan of care with the house staff. Please see Dr. Maudie Flakes note for complete details. 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  Consider checking for antibodies to confirm this week.   Sid Falcon, MD 05/04/2018, 7:56 PM

## 2018-05-04 NOTE — Progress Notes (Signed)
  Speech Language Pathology Treatment: Dysphagia  Patient Details Name: Reginald [name] MRN: [id number] DOB: 05/22/1941 Today's Date: 05/04/2018 Time: 5726-2035 SLP Time Calculation (min) (ACUTE ONLY): 19 min  Assessment / Plan / Recommendation Clinical Impression  SLP followed up for dysphagia. Chest CT showed 5.2 x 2.7 cm mass concerning for malignancy in the right upper lobe, probable pneumonia in the left lung base. [name] intake has been minimal per his son's report. Pt took single bite of pudding from his lunch tray, with no overt signs of aspiration, and declined other solids. He requested an icee, which he consumed with questionable signs of aspiration. Delayed cough x2 was productive of thick, brown secretions which were suctioned orally. He took sips of water, again with appearance of piecemealing and I suspect discoordination. I do question neurologic dysphagia; discussed instrumental testing (MBS) with pt and son for objective evaluation of swallow function. To be completed next date. Would continue current diet with aspiration precautions. Hold POs if pt coughing or exhibiting respiratory distress. D/w RN.    HPI HPI: Reginald [name] is a 77 yo with PMH of AAA (s/p stent in 2016), COPD, Asthma, DVT (2005), and BPH who presented with vertigo, nausea, and vomiting. MD noting concern for aspiration PNA, lungs with rhonchi and productive cough. 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 F/u chest CT results pending. MRI 04/29/18 with no acute findings, old cerebellar infarcts. Repeat imaging is being considered. Per PT notes, pt with new sudden onset opsiclonus-type eye movements, with ? medication side effect vs neurodegenerative etiology.      SLP Plan  MBS       Recommendations  Diet recommendations: Dysphagia 3 (mechanical soft);Thin liquid Liquids provided via: Cup;Straw Medication Administration: Whole meds with puree Supervision: Patient  able to self feed Compensations: Slow rate;Small sips/bites Postural Changes and/or Swallow Maneuvers: Seated upright 90 degrees                Oral Care Recommendations: Oral care BID Follow up Recommendations: Other (comment)(tbd) SLP Visit Diagnosis: Dysphagia, unspecified (R13.10) Plan: Reginald [name], Reginald [name], CCC-SLP Speech-Language Pathologist [place] 05/04/2018, 1:51 PM

## 2018-05-05 ENCOUNTER — Inpatient Hospital Stay (HOSPITAL_COMMUNITY): Payer: Medicare Other

## 2018-05-05 DIAGNOSIS — R918 Other nonspecific abnormal finding of lung field: Secondary | ICD-10-CM

## 2018-05-05 DIAGNOSIS — J431 Panlobular emphysema: Secondary | ICD-10-CM

## 2018-05-05 DIAGNOSIS — G119 Hereditary ataxia, unspecified: Secondary | ICD-10-CM

## 2018-05-05 DIAGNOSIS — R590 Localized enlarged lymph nodes: Secondary | ICD-10-CM

## 2018-05-05 LAB — RENAL FUNCTION PANEL
ALBUMIN: 2.8 g/dL — AB (ref 3.5–5.0)
ANION GAP: 8 (ref 5–15)
BUN: 16 mg/dL (ref 6–20)
CALCIUM: 8.7 mg/dL — AB (ref 8.9–10.3)
CO2: 29 mmol/L (ref 22–32)
CREATININE: 1.05 mg/dL (ref 0.61–1.24)
Chloride: 108 mmol/L (ref 101–111)
GFR calc Af Amer: >60 mL/min (ref >60)
GFR calc non Af Amer: >60 mL/min (ref >60)
GLUCOSE: 121 mg/dL — AB (ref 65–99)
PHOSPHORUS: 2.5 mg/dL (ref 2.5–4.6)
Potassium: 3.3 mmol/L — ABNORMAL LOW (ref 3.5–5.1)
SODIUM: 145 mmol/L (ref 135–145)

## 2018-05-05 LAB — CBC
HCT: 43.9 % (ref 39.0–52.0)
HEMOGLOBIN: 15.2 g/dL (ref 13.0–17.0)
MCH: 34 pg (ref 26.0–34.0)
MCHC: 34.6 g/dL (ref 30.0–36.0)
MCV: 98.2 fL (ref 78.0–100.0)
PLATELETS: 174 10*3/uL (ref 150–400)
RBC: 4.47 MIL/uL (ref 4.22–5.81)
RDW: 12.8 % (ref 11.5–15.5)
WBC: 9.9 10*3/uL (ref 4.0–10.5)

## 2018-05-05 LAB — MAGNESIUM: Magnesium: 2.3 mg/dL (ref 1.7–2.4)

## 2018-05-05 MED ORDER — NYSTATIN 100000 UNIT/ML MT SUSP
5.0000 mL | Freq: Four times a day (QID) | OROMUCOSAL | Status: DC
  Administered 2018-05-05 – 2018-05-14 (×22): 500000 [IU] via ORAL
  Filled 2018-05-05 (×30): qty 5

## 2018-05-05 MED ORDER — POTASSIUM CHLORIDE 20 MEQ/15ML (10%) PO SOLN
30.0000 meq | Freq: Two times a day (BID) | ORAL | Status: AC
  Administered 2018-05-05 (×2): 30 meq via ORAL
  Filled 2018-05-05 (×2): qty 30

## 2018-05-05 NOTE — Progress Notes (Signed)
Visited with patient and Toribio Harbour, patient's son and grandson was also present via [place].  Consult was placed for Advanced Directives.  Provided education for them regarding filling it out and we set up a time for 11:00 a.m. On Tuesday to come back with a notary and witnesses and get it completed.  I will share this with Eulogio Ditch, the unit Chaplain to work with this family to get this completed.    Thank you to the medical team caring for this patient.    05/05/18 1622  Clinical Encounter Type  Visited With Patient and family together  Visit Type Initial;Spiritual support

## 2018-05-05 NOTE — Progress Notes (Signed)
Subjective: Patient still feeling poorly, but appears to be increasing oral intake, almost constant nystagmus.  No chest pain, not short of breath, coughing about the same but no coughing while in room.  We discussed with pt how to proceed, pt wants as much information as possible as quickly as possible in pursuit of a definitive diagnosis.        Objective:  Vital signs in last 24 hours: Vitals:   05/04/18 0700 05/04/18 1515 05/04/18 2138 05/05/18 0625  BP:  (!) 154/84 (!) 136/58 (!) 148/81  Pulse:  81 66 72  Resp:  20 19 19   Temp: 97.8 F (36.6 C) 98.4 F (36.9 C) 99.3 F (37.4 C) 98.2 F (36.8 C)  TempSrc: Oral Oral Oral Oral  SpO2:  93% 95% 94%  Weight:      Height:       Physical Exam  Constitutional: He appears well-developed and well-nourished.  Eyes: Right eye exhibits no discharge. Left eye exhibits no discharge. No scleral icterus.  Cardiovascular: Normal rate, regular rhythm, normal heart sounds and intact distal pulses. Exam reveals no gallop and no friction rub.  No murmur heard. Pulmonary/Chest: Effort normal. No respiratory distress. He has decreased breath sounds in the left middle field and the left lower field. He has no wheezes. He has rhonchi in the right middle field and the right lower field. He has no rales.  Abdominal: Soft. Bowel sounds are normal. He exhibits no distension and no mass. There is no tenderness. There is no guarding.  Musculoskeletal: He exhibits no edema (no LE edema).  Neurological: He is alert.  Horizontal nystagmus seen, worse with leaning forward. Gets dizzy and nauseous when leaning forward feels like he is falling    Assessment/Plan:  Principal Problem:   Acute vestibular syndrome Active Problems:   Nausea and vomiting   Atrial fibrillation (HCC)   Vertigo  Reginald [name] is a 77 yo with PMH of AAA (s/p stent in 2016), COPD, Asthma, DVT (2005), and BPH who presented with vertigo, nausea, and vomiting. He was admitted to the  internal medicine teaching service for management. The specific problems addressed during admission are as follows:  Vertigo: BPPV vs progressive cerebellar dysfunction vs paraneoplastic syndrome dizziness and nausea/vomiting significantly improved after vestibular PT on HD#1 but have since returned and remains severe. Will continue vestibular PT cannot see pt again until 6/3.  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  Afebrile overnight  -started on ceftriaxone azithromycin -SLP eval pt on dysphagia 3 diet  New onset Atrial Fibrillation: Patient developed new onset A-Fib  while working with vestibular PT. Prior to A-Fib patient's HR  in the 50s. Patient currently remains in A-Fib with rates stable in the 80s-90s at rest. TSH and blood work within normal limits. Pt started on eliquis  -NSR today -holding  eliquis for biopsy -diltiazem 24hr 120mg  daily -PRN metoprolol for HR >140 -ECHO normal EF, looks good for pts age  COPD/Asthma: Does not seem to be in exacerbation based on exam, O2 requirement is almost certainly due to pneumonia.  On rescue inhaler and flovent at home  -continue to monitor -duonebs PRN Q8  BPH: Chronic, stable. Continue home doxazosin and finasteride daily  Code Status:  Full  Dispo: Anticipated discharge in approximately 4-5 day(s).   Reginald Roan, MD 05/05/2018, 8:56 AM Reginald Muff MD PGY-1 Internal Medicine Pager # [phone]

## 2018-05-05 NOTE — Care Management Important Message (Signed)
Important Message  Patient Details  Name: Reginald [name] MRN: [id number] Date of Birth: [birth year]   Medicare Important Message Given:  Yes    Orbie Pyo 05/05/2018, 3:38 PM

## 2018-05-05 NOTE — Progress Notes (Signed)
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  History from the patient reveals he was a heavy smoker in the past.  Brief clinical history: 77 year old man admitted on May 28 for further evaluation of unexplained cerebellar symptoms with ataxia and prominent nystagmus.  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  No gross pathology noted in the cerebellum.  No mass lesions.  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  Initial temporary improvement with Epley maneuvers. 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 We discussed findings and management plan with the patient and his son.  We will hold his anticoagulation and plan on a transcutaneous needle biopsy of the dominant lung mass by interventional radiology.  Further management based on results. Continuing treatment of likely left lower lobe aspiration pneumonia and new onset atrial fibrillation.

## 2018-05-05 NOTE — Consult Note (Signed)
Chief Complaint: Patient was seen in consultation today for right lung mass biopsy Chief Complaint  Patient presents with  . Nausea  . Emesis  . Fatigue   at the request of Dr Beryle Beams  Supervising Physician: Markus Daft  Patient Status: [facility] - In-pt  History of Present Illness: Reginald [name] is a 77 y.o. male    Ataxia and nystagmus MRI Brain - neg CT Chest: IMPRESSION: 1. There is a mass in the periphery of the right upper lobe posteriorly measuring 5.2 x 2.7 cm abutting the pleura without identified chest wall invasion. This mass is concerning for malignancy. There is 1 abnormal node in the mediastinum as well as a borderline node in the right hilum. I suspect at least 1 of these nodes is likely metastatic. Recommend a PET-CT for evaluation of the lung mass and lymph nodes. The mass will likely ultimately require tissue sampling for diagnosis. 2. Probable pneumonia in the left lung base. There is also a component of atelectasis. Recommend follow-up to resolution. 3. Atherosclerotic changes in the thoracic aorta. 4. Emphysematous changes in the lungs.  Right pleural based lung mass Request for biopsy per Dr Beryle Beams Dr Anselm Pancoast has reviewed imaging and spoke with Dr Beryle Beams Dr Anselm Pancoast approves procedure  LD Eliquis 6/2 pm Need off 2 days Will plan for bx 6/6  Past Medical History:  Diagnosis Date  . AAA (abdominal aortic aneurysm) ([place])   . Arthritis   . Asthma   . COPD (chronic obstructive pulmonary disease) ([place])   . DVT (deep venous thrombosis) ([place]) 2005   Right  leg  . Headache    cluster headaches  . Hyperlipidemia   . Joint pain   . Shortness of breath dyspnea    exertion  . Vertigo 04/29/2018    Past Surgical History:  Procedure Laterality Date  . ABDOMINAL AORTIC ENDOVASCULAR STENT GRAFT N/A 01/26/2015   Procedure: ABDOMINAL AORTIC ENDOVASCULAR STENT GRAFT;  Surgeon: Elam Dutch, MD;  Location: [place];  Service: Vascular;   Laterality: N/A;  . FRACTURE SURGERY  2005   Right heel - Fx and NO SURGERY    Allergies: Patient has no known allergies.  Medications: Prior to Admission medications   Medication Sig Start Date End Date Taking? Authorizing Provider  albuterol (PROVENTIL HFA;VENTOLIN HFA) 108 (90 BASE) MCG/ACT inhaler Inhale 2 puffs into the lungs every 4 (four) hours as needed for shortness of breath.    Yes [provider]  aspirin 325 MG tablet Take 325 mg by mouth at bedtime.    Yes [provider]  ciprofloxacin (CIPRO) 500 MG tablet Take 500 mg by mouth 2 (two) times daily. 04/17/18  Yes [provider]  clobetasol cream (TEMOVATE) 3.53 % Apply 1 application topically 2 (two) times daily as needed (for dry skin).  11/23/12  Yes [provider]  doxazosin (CARDURA) 4 MG tablet Take 4 mg by mouth at bedtime.    Yes [provider]  finasteride (PROSCAR) 5 MG tablet Take 5 mg by mouth at bedtime.  10/20/12  Yes [provider]  fluticasone (FLONASE) 50 MCG/ACT nasal spray Place 2 sprays into both nostrils daily.    Yes [provider]  fluticasone (FLOVENT HFA) 44 MCG/ACT inhaler Inhale 2 puffs into the lungs every other day.    Yes [provider]  methocarbamol (ROBAXIN) 500 MG tablet Take 500 mg by mouth as needed for muscle spasms (Muscle spasms).    Yes [provider]  ondansetron (ZOFRAN) 4 MG tablet Take 4 mg by mouth daily as needed. 04/28/18  Yes [provider]  topiramate (TOPAMAX) 50 MG tablet Take 50 mg by mouth as needed.   Yes [provider]  triamcinolone ointment (KENALOG) 0.1 % Apply 1 application topically as needed (for dry skin).  12/22/14  Yes [provider]     Family History  Problem Relation Age of Onset  . Hyperlipidemia Mother   . Hypertension Mother   . Cancer Father        Bladder  . Hyperlipidemia Father   . Hypertension Father   . Kidney disease Father   .  Heart disease Brother        Heart Disease before age 18  . Heart attack Brother        x's 2  . Hyperlipidemia Brother   . Hypertension Brother   . Hyperlipidemia Sister   . Hypertension Sister     Social History   Socioeconomic History  . Marital status: Married    Spouse name: Not on file  . Number of children: 2  . Years of education: Not on file  . Highest education level: Not on file  Occupational History  . Not on file  Social Needs  . Financial resource strain: Not on file  . Food insecurity:    Worry: Not on file    Inability: Not on file  . Transportation needs:    Medical: Not on file    Non-medical: Not on file  Tobacco Use  . Smoking status: Former Smoker    Packs/day: 1.00    Years: 30.00    Pack years: 30.00    Types: E-cigarettes, Cigarettes    Last attempt to quit: 03/05/2012    Years since quitting: 6.1  . Smokeless tobacco: Never Used  Substance and Sexual Activity  . Alcohol use: No    Alcohol/week: 0.0 oz  . Drug use: No  . Sexual activity: Not on file  Lifestyle  . Physical activity:    Days per week: Not on file    Minutes per session: Not on file  . Stress: Not on file  Relationships  . Social connections:    Talks on phone: Not on file    Gets together: Not on file    Attends religious service: Not on file    Active member of club or organization: Not on file    Attends meetings of clubs or organizations: Not on file    Relationship status: Not on file  Other Topics Concern  . Not on file  Social History Narrative  . Not on file    Review of Systems: A 12 point ROS discussed and pertinent positives are indicated in the HPI above.  All other systems are negative.  Review of Systems  Constitutional: Positive for activity change, appetite change and fatigue. Negative for fever.  Respiratory: Negative for shortness of breath.   Cardiovascular: Negative for chest pain.  Gastrointestinal: Negative for abdominal pain.    Musculoskeletal: Positive for gait problem.  Neurological: Positive for weakness.  Psychiatric/Behavioral: Positive for decreased concentration. Negative for behavioral problems and confusion.    Vital Signs: BP (!) 146/91 (BP Location: Left Arm)   Pulse 73   Temp 97.9 F (36.6 C) (Oral)   Resp 18   Ht 5\' 10"  (1.778 m)   Wt 210 lb 12.2 oz (95.6 kg)   SpO2 95%   BMI 30.24 kg/m   Physical Exam  Constitutional:  He is oriented to person, place, and time.  Eyes:  Bilat nystagmus  Cardiovascular: Normal rate and regular rhythm.  Pulmonary/Chest: Effort normal and breath sounds normal.  Abdominal: Soft.  Musculoskeletal: Normal range of motion.  Neurological: He is alert and oriented to person, place, and time.  Skin: Skin is warm and dry.  Psychiatric: He has a normal mood and affect. His behavior is normal. Judgment and thought content normal.  Consented son at bedside  Vitals reviewed.   Imaging: Ct Head Wo Contrast  Result Date: 04/29/2018 CLINICAL DATA:  Nausea, emesis and fatigue beginning 3 days ago. EXAM: CT HEAD WITHOUT CONTRAST TECHNIQUE: Contiguous axial images were obtained from the base of the skull through the vertex without intravenous contrast. COMPARISON:  12/01/2012 FINDINGS: Brain: Generalized brain atrophy. Extensive chronic small-vessel ischemic changes throughout the cerebral hemispheric white matter. 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 No sign of acute infarction, mass lesion, hemorrhage, hydrocephalus or extra-axial collection. Findings are slightly progressive since 2013. Vascular: There is atherosclerotic calcification of the major vessels at the base of the brain. Skull: Negative Sinuses/Orbits: Clear/normal Other: None IMPRESSION: No acute finding by CT. Advanced chronic appearing small vessel ischemic changes of the cerebral hemispheric white matter, somewhat progressive since 2013. 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 Electronically Signed   By:  Nelson Chimes M.D.   On: 04/29/2018 17:02   Ct Chest W Contrast  Result Date: 05/03/2018 CLINICAL DATA:  Mass in the right lung seen on chest x-ray. EXAM: CT CHEST WITH CONTRAST TECHNIQUE: Multidetector CT imaging of the chest was performed during intravenous contrast administration. CONTRAST:  176mL OMNIPAQUE IOHEXOL 300 MG/ML  SOLN COMPARISON:  None. FINDINGS: Cardiovascular: The heart size is normal. Atherosclerotic changes seen in the thoracic aorta without dissection. A gentle bulge along the arch on image 62 is identified. No gross aneurysm. Central pulmonary arteries are normal in caliber with no obvious filling defects. Mediastinum/Nodes: The thyroid and esophagus are normal. An enlarged node is seen in the precarinal region with a short axis measurement of 2.7 cm. A mildly prominent right hilar node is seen on image 89 measuring 13 mm in short axis. No other definitive adenopathy. No effusions. Lungs/Pleura: Central airways are normal. No pneumothorax. There is patchy opacity in left base. While there is certainly a component of atelectasis, I suspect infiltrate as well. There is a mass in the periphery of the right upper lobe posteriorly abutting the pleura measuring 5.2 by 2.7 cm. No definitive invasion into the adjacent chest wall. No other nodules or masses. Severe emphysematous changes in the lungs. Upper Abdomen: A small nodule posterior to the spleen may represent a splenule. There is a cyst in the upper pole of the right kidney. Adrenal glands are normal. No other acute abnormalities. Musculoskeletal: No evidence of bony metastatic disease. IMPRESSION: 1. There is a mass in the periphery of the right upper lobe posteriorly measuring 5.2 x 2.7 cm abutting the pleura without identified chest wall invasion. This mass is concerning for malignancy. There is 1 abnormal node in the mediastinum as well as a borderline node in the right hilum. I suspect at least 1 of these nodes is likely metastatic.  Recommend a PET-CT for evaluation of the lung mass and lymph nodes. The mass will likely ultimately require tissue sampling for diagnosis. 2. Probable pneumonia in the left lung base. There is also a component of atelectasis. Recommend follow-up to resolution. 3. Atherosclerotic changes in the thoracic aorta. 4. Emphysematous changes  in the lungs. Aortic Atherosclerosis (ICD10-I70.0) and Emphysema (ICD10-J43.9). Electronically Signed   By: Dorise Bullion III M.D   On: 05/03/2018 18:00   Mr Brain Wo Contrast  Result Date: 04/29/2018 CLINICAL DATA:  Nausea and vomiting, fatigue for 3 days. Assess persistent central vertigo. EXAM: MRI HEAD WITHOUT CONTRAST TECHNIQUE: Multiplanar, multiecho pulse sequences of the brain and surrounding structures were obtained without intravenous contrast. COMPARISON:  CT HEAD Apr 29, 2018 FINDINGS: INTRACRANIAL CONTENTS: No reduced diffusion to suggest acute ischemia. Faint punctate susceptibility artifact RIGHT cerebrum may be extra-axial. The ventricles and sulci are normal for patient's age. No mass effect or masses. Confluent supratentorial white matter FLAIR T2 hyperintensities. 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 No abnormal extra-axial fluid collections. No extra-axial masses. VASCULAR: Normal major intracranial vascular flow voids present at skull base. SKULL AND UPPER CERVICAL SPINE: No abnormal sellar expansion. No suspicious calvarial bone marrow signal. Craniocervical junction maintained. SINUSES/ORBITS: RIGHT frontal mucosal retention cysts in trace paranasal sinus mucosal thickening without air-fluid levels. Mastoid air cells are well aerated. Included ocular globes and orbital contents are non-suspicious. OTHER: None. IMPRESSION: 1. No acute intracranial process. 2. 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 3. Severe chronic small vessel ischemic changes. Old small cerebellar infarcts. Electronically Signed   By:  Elon Alas M.D.   On: 04/29/2018 20:33   Dg Chest Port 1 View  Result Date: 05/03/2018 CLINICAL DATA:  77 year old male with rhonchi and cough EXAM: PORTABLE CHEST 1 VIEW COMPARISON:  Prior chest x-ray 01/26/2015 FINDINGS: Interval development of a 3.7 cm nodular opacity overlying the right upper lung. Stable borderline cardiomegaly. Bilateral bronchitic changes and bibasilar interstitial prominence are similar compared to prior. Atherosclerotic calcifications are present along the transverse aorta. No evidence of pleural effusion or pneumothorax. Upper lung predominant hyperlucency consistent with underlying emphysema. No acute osseous abnormality. Incompletely imaged aortic endograft. IMPRESSION: 1. Approximately 3.7 cm masslike opacity overlying the right upper lung concerning for an underlying bronchogenic carcinoma. A region of masslike consolidation in the setting of pneumonia could appear similar. Recommend further evaluation with CT scan of the chest. 2. Stable cardiomegaly. 3. Stable chronic emphysematous, bronchitic and interstitial changes consistent with the clinical history of COPD. 4.  Aortic Atherosclerosis (ICD10-170.0) Electronically Signed   By: Jacqulynn Cadet M.D.   On: 05/03/2018 09:27   Dg Swallowing Func-speech Pathology  Result Date: 05/05/2018 Objective Swallowing Evaluation: Type of Study: MBS-Modified Barium Swallow Study  Patient Details Name: Reginald [name] MRN: [id number] Date of Birth: 1941-01-20 Today's Date: 05/05/2018 Time: SLP Start Time (ACUTE ONLY): 1020 -SLP Stop Time (ACUTE ONLY): 4270 SLP Time Calculation (min) (ACUTE ONLY): 72 min Past Medical History: Past Medical History: Diagnosis Date . AAA (abdominal aortic aneurysm) ([place])  . Arthritis  . Asthma  . COPD (chronic obstructive pulmonary disease) ([place])  . DVT (deep venous thrombosis) ([place]) 2005  Right  leg . Headache   cluster headaches . Hyperlipidemia  . Joint pain  . Shortness of breath dyspnea   exertion .  Vertigo 04/29/2018 Past Surgical History: Past Surgical History: Procedure Laterality Date . ABDOMINAL AORTIC ENDOVASCULAR STENT GRAFT N/A 01/26/2015  Procedure: ABDOMINAL AORTIC ENDOVASCULAR STENT GRAFT;  Surgeon: Elam Dutch, MD;  Location: [place];  Service: Vascular;  Laterality: N/A; . FRACTURE SURGERY  2005  Right heel - Fx and NO SURGERY HPI: Karee Christopherson is a 77 yo with PMH of AAA (s/p stent in 2016), COPD, Asthma, DVT (2005), and BPH who presented with vertigo,  nausea, and vomiting. MD noting concern for aspiration PNA, lungs with rhonchi and productive cough. 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 F/u chest CT results pending. MRI 04/29/18 with no acute findings, old cerebellar infarcts. Repeat imaging is being considered. Per PT notes, pt with new sudden onset opsiclonus-type eye movements, with ? medication side effect vs neurodegenerative etiology. MBSS scheduled following question of clinical s/s of dysphagia with thin liquid and likely LLL pna  Subjective: Pt awake, alert, participative.  Son present for MBSS Assessment / Plan / Recommendation CHL IP CLINICAL IMPRESSIONS 05/05/2018 Clinical Impression Pt presents with functional oropharyngeal swallowing.  There was no penetration or aspiration with any consistencies trialed.  There was mild vallecula residue with puree, increasing to moderate with regular solid which.  Residue was cleared with with liquid wash.  There was stasis of tablet revealed with esophageal sweep which cleared with repeat liquid wash. Recommend regular diet with thin liquid.  SLP to follow up for diet tolerance. SLP Visit Diagnosis Dysphagia, unspecified (R13.10) Attention and concentration deficit following -- Frontal lobe and executive function deficit following -- Impact on safety and function --   CHL IP TREATMENT RECOMMENDATION 05/05/2018 Treatment Recommendations No treatment recommended at this time   Prognosis 05/03/2018  Prognosis for Safe Diet Advancement Good Barriers to Reach Goals -- Barriers/Prognosis Comment -- CHL IP DIET RECOMMENDATION 05/05/2018 SLP Diet Recommendations Regular solids;Thin liquid Liquid Administration via Cup;Straw Medication Administration Whole meds with liquid Compensations (No Data) Postural Changes --   CHL IP OTHER RECOMMENDATIONS 05/05/2018 Recommended Consults -- Oral Care Recommendations Oral care BID Other Recommendations --   CHL IP FOLLOW UP RECOMMENDATIONS 05/05/2018 Follow up Recommendations None   CHL IP FREQUENCY AND DURATION 05/03/2018 Speech Therapy Frequency (ACUTE ONLY) min 2x/week Treatment Duration 2 weeks      CHL IP ORAL PHASE 05/05/2018 Oral Phase WFL Oral - Pudding Teaspoon -- Oral - Pudding Cup -- Oral - Honey Teaspoon -- Oral - Honey Cup -- Oral - Nectar Teaspoon -- Oral - Nectar Cup -- Oral - Nectar Straw -- Oral - Thin Teaspoon -- Oral - Thin Cup -- Oral - Thin Straw -- Oral - Puree -- Oral - Mech Soft -- Oral - Regular -- Oral - Multi-Consistency -- Oral - Pill -- Oral Phase - Comment --  CHL IP PHARYNGEAL PHASE 05/05/2018 Pharyngeal Phase -- Pharyngeal- Pudding Teaspoon -- Pharyngeal -- Pharyngeal- Pudding Cup -- Pharyngeal -- Pharyngeal- Honey Teaspoon -- Pharyngeal -- Pharyngeal- Honey Cup -- Pharyngeal -- Pharyngeal- Nectar Teaspoon -- Pharyngeal -- Pharyngeal- Nectar Cup -- Pharyngeal -- Pharyngeal- Nectar Straw -- Pharyngeal -- Pharyngeal- Thin Teaspoon -- Pharyngeal -- Pharyngeal- Thin Cup WFL Pharyngeal -- Pharyngeal- Thin Straw WFL Pharyngeal -- Pharyngeal- Puree Pharyngeal residue - valleculae Pharyngeal -- Pharyngeal- Mechanical Soft -- Pharyngeal -- Pharyngeal- Regular Pharyngeal residue - valleculae Pharyngeal -- Pharyngeal- Multi-consistency -- Pharyngeal -- Pharyngeal- Pill WFL Pharyngeal -- Pharyngeal Comment --  CHL IP CERVICAL ESOPHAGEAL PHASE 05/05/2018 Cervical Esophageal Phase WFL Pudding Teaspoon -- Pudding Cup -- Honey Teaspoon -- Honey Cup -- Nectar Teaspoon --  Nectar Cup -- Nectar Straw -- Thin Teaspoon -- Thin Cup -- Thin Straw -- Puree -- Mechanical Soft -- Regular -- Multi-consistency -- Pill -- Cervical Esophageal Comment -- No flowsheet data found. Leigh E Borum 05/05/2018, 2:49 PM               Labs:  CBC: Recent Labs    04/29/18 1052 04/30/18 0416 05/04/18 6195  05/05/18 0725  WBC 7.8 9.5 11.1* 9.9  HGB 15.0 14.5 15.8 15.2  HCT 42.8 41.4 45.8 43.9  PLT 207 216 195 174    COAGS: Recent Labs    04/29/18 1733  INR 1.10  APTT 24    BMP: Recent Labs    04/30/18 0416 05/02/18 0533 05/04/18 0707 05/05/18 0725  NA 142 136 141 145  K 3.8 3.1* 3.0* 3.3*  CL 104 98* 107 108  CO2 26 26 26 29   GLUCOSE 109* 124* 147* 121*  BUN 24* 22* 21* 16  CALCIUM 9.3 9.1 8.8* 8.7*  CREATININE 1.14 1.09 1.05 1.05  GFRNONAA >60 >60 >60 >60  GFRAA >60 >60 >60 >60    LIVER FUNCTION TESTS: Recent Labs    04/29/18 1052 05/04/18 0707 05/05/18 0725  BILITOT 1.9*  --   --   AST 18  --   --   ALT 14*  --   --   ALKPHOS 57  --   --   PROT 7.4  --   --   ALBUMIN 3.8 3.0* 2.8*    TUMOR MARKERS: No results for input(s): AFPTM, CEA, CA199, CHROMGRNA in the last 8760 hours.  Assessment and Plan:  New Bilat nystagmus New ataxia Right lung mass and LAN Scheduled for biopsy 6/6 in IR (off Eliquis now-- LD 6/2 pm)  Risks and benefits discussed with the patient including, but not limited to bleeding, hemoptysis, respiratory failure requiring intubation, infection, pneumothorax requiring chest tube placement, stroke from air embolism or even death.  All of the patient's questions were answered, patient is agreeable to proceed. Consent signed and in chart.   Thank you for this interesting consult.  I greatly enjoyed meeting CYPRESS FANFAN and look forward to participating in their care.  A copy of this report was sent to the requesting provider on this date.  Electronically Signed: Lavonia Drafts, PA-C 05/05/2018, 4:30 PM   I spent a  total of 40 Minutes    in face to face in clinical consultation, greater than 50% of which was counseling/coordinating care for right lung mass biopsy

## 2018-05-05 NOTE — Progress Notes (Addendum)
Physical Therapy Treatment Patient Details Name: Reginald [name] MRN: [id number] DOB: September 20, 1941 Today's Date: 05/05/2018    History of Present Illness Mr Winkleman is a 54 to M with Hx of AAA (s/p stent in 2016), COPD, Asthma, DVT (2005), and R heel fx who presented with nausea and vomiting for several days PTA. Vertigo is thought to be BPPV vs progressive cerebellar dysfunction vs paraneoplatic syndrome, newly dx lung mass due for bx on 05/06/18, L LL PNA, and new onset A-fib.      PT Comments    Pt continues to have opsiclonus and myoclonic jerking during our session.  The jerking is gone in supine, but the eye movement persists.  We did discover today that when one eye is covered that his visual field no longer moves like it does with both eyes open, so I brought him an eye patch to try with our next session with instructions to his son to alternate sides (R today, left tomorrow and vice versa) as it did not seem to matter which eye I covered.  I would like to try to pre medicate him tomorrow with nausea meds and antivert to see if we can get his symptoms under control to get him more functional (move more, eat more).  Spontaneous nystagmus is not typically a sign of BPPV, but it doesn't mean that there is not an underlying issue hard to tell with his eye constantly in motion.  We will continue to assess his vestibular system and treat as best we can while the team works on his medical issues.    Follow Up Recommendations  SNF     Equipment Recommendations  Rolling walker with 5" wheels;3in1 (PT)    Recommendations for Other Services   NA     Precautions / Restrictions Precautions Precautions: Fall    Mobility  Bed Mobility Overal bed mobility: Needs Assistance Bed Mobility: Sidelying to Sit;Sit to Sidelying   Sidelying to sit: Mod assist     Sit to sidelying: Mod assist General bed mobility comments: Pt reluctantly agreeable to work with PT today as he reports significant nausea and  dizziness.  PT held a hand over one of his eye as it seemed to help stop his constant sense that his visual field was moving.    Transfers                 General transfer comment: Pt sat EOB for a short period of time, was not open to getting up today.          Balance     05/05/18 1742  Vestibular Assessment  General Observation Pt nauseated, not wanting to move.    Symptom Behavior  Type of Dizziness Comment (nausea, visual field in constant motion)  Frequency of Dizziness with movement  Duration of Dizziness long  Aggravating Factors Activity in general (being upright)  Relieving Factors Closing eyes;Rest;Lying supine  Occulomotor Exam  Spontaneous Comment (opsiclonus (spasms almost) no true identifiable direction)  Gaze-induced Comment (difficult to tell due to the osscilations)  Smooth Pursuits Saccades  Comment The only new thing that I leared today is that when I occlude one eye, he states that he reports his visual field stabilizes.    Vestibulo-Occular Reflex  VOR Cancellation  (with one eye covered, makes him sick)  Auditory  Comments attempted to grossly test hearing and difficult to say because he is in side lying and lifted up for me to test his left hear, but reports muffled  in the left.  It seemed as though when the opsiclonus stopped that there were some left beats of nystagmus which is why I tested his hearing grossly.                                              Cognition Arousal/Alertness: Awake/alert Behavior During Therapy: Flat affect Overall Cognitive Status: Within Functional Limits for tasks assessed                                               Pertinent Vitals/Pain Pain Assessment: No/denies pain           PT Goals (current goals can now be found in the care plan section) Acute Rehab PT Goals Patient Stated Goal: to go home Progress towards PT goals: Progressing toward goals     Frequency    Min 4X/week      PT Plan Current plan remains appropriate;Frequency needs to be updated       AM-PAC PT "6 Clicks" Daily Activity  Outcome Measure  Difficulty turning over in bed (including adjusting bedclothes, sheets and blankets)?: Unable Difficulty moving from lying on back to sitting on the side of the bed? : Unable Difficulty sitting down on and standing up from a chair with arms (e.g., wheelchair, bedside commode, etc,.)?: Unable Help needed moving to and from a bed to chair (including a wheelchair)?: A Lot Help needed walking in hospital room?: A Lot Help needed climbing 3-5 steps with a railing? : Total 6 Click Score: 8    End of Session Equipment Utilized During Treatment: Oxygen Activity Tolerance: Patient limited by fatigue;Other (comment)(limited by nausea and dizziness. ) Patient left: in bed;with call bell/phone within reach;with family/visitor present Nurse Communication: Mobility status;Other (comment)(eye patch may help) PT Visit Diagnosis: Unsteadiness on feet (R26.81);Muscle weakness (generalized) (M62.81);BPPV;Dizziness and giddiness (R42)     Time: 2353-6144 PT Time Calculation (min) (ACUTE ONLY): 39 min  Charges:  $Therapeutic Activity: 23-37 mins $Self Care/Home Management: 8-22          Vastie Douty B. [place], [place], DPT [phone]            05/05/2018, 6:07 PM

## 2018-05-05 NOTE — Procedures (Signed)
Objective Swallowing Evaluation: Type of Study: MBS-Modified Barium Swallow Study   Patient Details  Name: Reginald [name] MRN: [id number] Date of Birth: [birth year]  Today's Date: 05/05/2018 Time: SLP Start Time (ACUTE ONLY): 1020 -SLP Stop Time (ACUTE ONLY): 1132  SLP Time Calculation (min) (ACUTE ONLY): 72 min   Past Medical History:  Past Medical History:  Diagnosis Date  . AAA (abdominal aortic aneurysm) ([place])   . Arthritis   . Asthma   . COPD (chronic obstructive pulmonary disease) ([place])   . DVT (deep venous thrombosis) ([place]) 2005   Right  leg  . Headache    cluster headaches  . Hyperlipidemia   . Joint pain   . Shortness of breath dyspnea    exertion  . Vertigo 04/29/2018   Past Surgical History:  Past Surgical History:  Procedure Laterality Date  . ABDOMINAL AORTIC ENDOVASCULAR STENT GRAFT N/A 01/26/2015   Procedure: ABDOMINAL AORTIC ENDOVASCULAR STENT GRAFT;  Surgeon: Elam Dutch, MD;  Location: [facility];  Service: Vascular;  Laterality: N/A;  . FRACTURE SURGERY  2005   Right heel - Fx and NO SURGERY   HPI: Reginald [name] is a 77 yo with PMH of AAA (s/p stent in 2016), COPD, Asthma, DVT (2005), and BPH who presented with vertigo, nausea, and vomiting. MD noting concern for aspiration PNA, lungs with rhonchi and productive cough. 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 F/u chest CT results pending. MRI 04/29/18 with no acute findings, old cerebellar infarcts. Repeat imaging is being considered. Per PT notes, pt with new sudden onset opsiclonus-type eye movements, with ? medication side effect vs neurodegenerative etiology. MBSS scheduled following question of clinical s/s of dysphagia with thin liquid and likely LLL pna   Subjective: Pt awake, alert, participative.  Son present for MBSS    Assessment / Plan / Recommendation  CHL IP CLINICAL IMPRESSIONS 05/05/2018  Clinical Impression Pt presents with functional  oropharyngeal swallowing.  There was no penetration or aspiration with any consistencies trialed.  There was mild vallecula residue with puree, increasing to moderate with regular solid which.  Residue was cleared with with liquid wash.  There was stasis of tablet revealed with esophageal sweep which cleared with repeat liquid wash. Recommend regular diet with thin liquid.  SLP to follow up for diet tolerance.  SLP Visit Diagnosis Dysphagia, unspecified (R13.10)      CHL IP TREATMENT RECOMMENDATION 05/05/2018  Treatment Recommendations Follow up for diet tolerance      CHL IP DIET RECOMMENDATION 05/05/2018  SLP Diet Recommendations Regular solids;Thin liquid  Liquid Administration via Cup;Straw  Medication Administration Whole meds with liquid  Compensations Alternate solids and liquids (liquid wash as needed)      CHL IP FOLLOW UP RECOMMENDATIONS 05/05/2018  Follow up Recommendations SLP to follow up for diet tolerannce        CHL IP ORAL PHASE 05/05/2018  Oral Phase WFL    CHL IP PHARYNGEAL PHASE 05/05/2018  Pharyngeal- Thin Cup WFL  Pharyngeal- Thin Straw WFL  Pharyngeal- Puree Pharyngeal residue - valleculae  Pharyngeal- Regular Pharyngeal residue - valleculae  Pharyngeal- Pill WFL     CHL IP CERVICAL ESOPHAGEAL PHASE 05/05/2018  Cervical Esophageal Phase WFL    No flowsheet data found.  Celedonio Savage, MA, CCC-SLP 05/05/2018, 12:58 PM

## 2018-05-05 NOTE — Progress Notes (Signed)
PT Cancellation Note  Patient Details Name: Reginald [name] MRN: [id number] DOB: 09/07/1941   Cancelled Treatment:    Reason Eval/Treat Not Completed: Patient at procedure or test/unavailable.  Pt is in x-ray for swallowing study.   Thanks,    Barbarann Ehlers. Bradrick Kamau, PT, DPT [phone]   05/05/2018, 11:04 AM

## 2018-05-06 DIAGNOSIS — H5500 Unspecified nystagmus: Secondary | ICD-10-CM

## 2018-05-06 LAB — CBC
HEMATOCRIT: 42.6 % (ref 39.0–52.0)
HEMOGLOBIN: 14.7 g/dL (ref 13.0–17.0)
MCH: 33.8 pg (ref 26.0–34.0)
MCHC: 34.5 g/dL (ref 30.0–36.0)
MCV: 97.9 fL (ref 78.0–100.0)
Platelets: 179 10*3/uL (ref 150–400)
RBC: 4.35 MIL/uL (ref 4.22–5.81)
RDW: 12.7 % (ref 11.5–15.5)
WBC: 9.4 10*3/uL (ref 4.0–10.5)

## 2018-05-06 LAB — PROTIME-INR
INR: 1.19
PROTHROMBIN TIME: 15 s (ref 11.4–15.2)

## 2018-05-06 NOTE — Progress Notes (Signed)
Completed AD forms with patient and 2 sons.  Placed one copy with the unit secretary for his chart and original and 3 copies to for family to have.  Had prayer with patient and family and  included patients' wife who just had spinal surgery recently who is healing at home. Conard Novak, Chaplain   05/06/18 1100  Clinical Encounter Type  Visited With Patient and family together  Visit Type Spiritual support;Other (Comment) (complete AD)  Referral From Family  Consult/Referral To Chaplain  Spiritual Encounters  Spiritual Needs Prayer  Stress Factors  Patient Stress Factors Health changes  Family Stress Factors Health changes  Advance Directives (For Healthcare)  Does Patient Have a Medical Advance Directive? Yes  Type of Paramedic of [place];Living will

## 2018-05-06 NOTE — Progress Notes (Signed)
SLP Cancellation Note  Patient Details Name: JSHAWN HURTA MRN: [id number] DOB: [birth year]   Cancelled treatment:       Reason Eval/Treat Not Completed: Other (Attempted to see pt twice this afternoon.  Both attempts pt was nauseated and very tired.  Will defer treatment at this time until pt is better able to tolerate POs)   May Ozment, Selinda Orion 05/06/2018, 3:58 PM

## 2018-05-06 NOTE — Progress Notes (Signed)
Subjective: Patient feels cough has lessened.  Reginald [name] is still anxious for an answer.  Still [name], Reginald [name] with physical therapy some yesterday.   Objective:  Vital signs in last 24 hours: Vitals:   05/05/18 2050 05/06/18 0601 05/06/18 0823 05/06/18 0947  BP: (!) 143/70 (!) 157/70  (!) 159/74  Pulse: 72 [name] 68   Resp:   16   Temp: 98.2 F (36.8 C) 98.2 F (36.8 C)    TempSrc: Oral Oral    SpO2: 90% 96% 94%   Weight:      Height:       Physical Exam  Constitutional: Reginald [name] appears well-developed and well-nourished.  Eyes: Right eye exhibits no discharge. Left eye exhibits no discharge. No scleral icterus.  Cardiovascular: Normal rate, regular rhythm, normal heart sounds and intact distal pulses. Exam reveals no gallop and no friction rub.  No murmur heard. Pulmonary/Chest: Effort normal. No respiratory distress. Reginald [name] has decreased breath sounds (more air movement appreciated today) in the left middle field and the left lower field. Reginald [name] has no wheezes. Reginald [name] has rhonchi (improved) in the right middle field. Reginald [name] has no rales.  Abdominal: Soft. Bowel sounds are normal. Reginald [name] exhibits no distension and no mass. There is no tenderness. There is no guarding.  Musculoskeletal: Reginald [name] exhibits no edema (no LE edema).  Neurological: Reginald [name] is alert.  Horizontal nystagmus seen, worse with leaning forward. Heel to shin preserved, no finger to nose dysmetria on right, some on left Gets [name] and nauseous when leaning forward feels like Reginald [name] is falling    Assessment/Plan:  Principal Problem:   Acute vestibular syndrome Active Problems:   Nausea and vomiting   Atrial fibrillation (HCC)   Vertigo   Mass of upper lobe of right lung   Panlobular emphysema (HCC)  Reginald [name] [name] is a 77 yo with PMH of AAA (s/p stent in 2016), COPD, Asthma, DVT (2005), and BPH who presented with vertigo, nausea, and vomiting. Reginald [name] was admitted to the internal medicine teaching service for management. The specific problems addressed  during admission are as follows:  Vertigo: BPPV vs progressive cerebellar dysfunction vs paraneoplastic syndrome dizziness and nausea/vomiting significantly improved after vestibular PT on HD#1 but have since returned and remains severe. Will continue vestibular PT cannot see pt again until 6/3.  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  Afebrile overnight  -white count improved -continuing unasyn   New onset Atrial Fibrillation: Patient developed new onset A-Fib  while working with vestibular PT. Prior to A-Fib patient's HR  in the 50s. Patient currently remains in A-Fib with rates stable in the 80s-90s at rest. TSH and blood work within normal limits. Pt started on eliquis  -still in NSR -holding eliquis for biopsy -diltiazem 24hr 120mg  daily -PRN metoprolol for HR >140 -ECHO normal EF, looks good for pts age  COPD/Asthma: Does not  seem to be in exacerbation based on exam, O2 requirement is almost certainly due to pneumonia.  On rescue inhaler and flovent at home  -continue to monitor -duonebs PRN Q8  BPH: Chronic, stable. Continue home doxazosin and finasteride daily  Code Status: Full  Dispo: Anticipated discharge in approximately 4-5 day(s).   [name], MD 05/06/2018, 10:59 AM [name]  MD PGY-1 Internal Medicine Pager # [phone]

## 2018-05-06 NOTE — Progress Notes (Signed)
Medicine attending: I examined this patient today together with resident physician Dr. Guadlupe Spanish and I concur with his evaluation and management plan. Asymptomatic at rest from the pulmonary standpoint.  Holding anticoagulants.  Plan for transcutaneous biopsy of right pleural-based lung mass on June 6. Persistent lateral nystagmus but no other cerebellar signs.  No past pointing.  Normal rapid alternating movements.  Normal heel-to-shin.  Reason for the nystagmus remains elusive.  Paraneoplastic versus progressive vertebrobasilar insufficiency versus other.  We will ask neurology to evaluate for any additional ideas on diagnosis and management.  He is receiving as needed meclizine and ondansetron for symptomatic nausea. Patient's son is present.  Status and management plan reviewed.

## 2018-05-06 NOTE — Progress Notes (Signed)
Physical Therapy Treatment Patient Details Name: Reginald [name] MRN: [id number] DOB: 06-Mar-1941 Today's Date: 05/06/2018    History of Present Illness [name] is a 8 to M with Hx of AAA (s/p stent in 2016), COPD, Asthma, DVT (2005), and R heel fx who presented with nausea and vomiting for several days PTA. Vertigo is thought to be BPPV vs progressive cerebellar dysfunction vs paraneoplatic syndrome, newly dx lung mass due for bx on 05/06/18, L LL PNA, and new onset A-fib.      PT Comments    Attempted EOB with eye patch and glasses on this evening and pt unable to tolerate more than 5 mins both because of significant myoclonic jerking and continued nausea.  He did not want to be premedicated this PM because he reported the meds don't help.  The eye patch seemed to help settle his perception of visual motion, but did not change his other symptoms.  I asked for a tilt bed from the medical team as he continues to be unable to tolerate OOB to chair (he has only be OOB once in 7 days) and this may provide a way for him to be passively upright.  I will continue to research and work with the team on diff dx and management of Reginald [name].    Follow Up Recommendations  SNF     Equipment Recommendations  Rolling walker with 5" wheels;3in1 (PT);Wheelchair (measurements PT);Wheelchair cushion (measurements PT)    Recommendations for Other Services   NA     Precautions / Restrictions Precautions Precautions: Fall    Mobility  Bed Mobility Overal bed mobility: Needs Assistance Bed Mobility: Rolling;Sidelying to Sit;Sit to Supine Rolling: Min assist Sidelying to sit: +2 for physical assistance;Mod assist;HOB elevated     Sit to sidelying: +2 for physical assistance;Mod assist General bed mobility comments: Pt was able to roll with use of the rail.  transitions to sitting and back to supine require two person mod assist due to significant tremoring during transitions.    Transfers                  General transfer comment: Pt not agreeable at this time to OOB. Per son, it took two people to do it two days ago.          Balance Overall balance assessment: Needs assistance Sitting-balance support: Feet supported;Bilateral upper extremity supported Sitting balance-Leahy Scale: Poor Sitting balance - Comments: mod assist EOB due to significant all over tremoring of trunk, head and arms in sitting. Pt was able to stay upright for ~5 mins.  [name] tremoring does decrease in intensity (at it's most significant during initial sitting EOB), but nausea and dizziness persist.  He did not want nausea meds or dizziness meds before his session because he says that they do not help.  Eye patch applied to his left eye during our session as well as his glasses and he reports that there are periods of time where "you stop moving" or his visual field settles, but this does not seem to be helping his mobility at this point in time.  I encouraged him to be on his right side and keep the eye patch on his left eye for now, even if he is just dozing as to try to decrease his visual motion perception.           05/06/18 1810  Vestibular Assessment  General Observation Pt continues to have occular flutter/opsoclunus (even with his eyes closed you can see  his eyes moving back and forth).    Symptom Behavior  Type of Dizziness Comment (falling forward, moving visual field.)  Frequency of Dizziness constant  Duration of Dizziness days  Aggravating Factors Activity in general  Relieving Factors Lying supine;Closing eyes  Occulomotor Exam  Spontaneous Comment (ocular flutter or opsiclonus)  Gaze-induced Comment (opsoclonus)  Positional Testing  Dix-Hallpike Dix-Hallpike Right  Horizontal Canal Testing Horizontal Canal Right  Dix-Hallpike Right  Dix-Hallpike Right Duration 0  Dix-Hallpike Right Symptoms Other (comment) (continuous unchanging nystagmus and symptoms)  Horizontal Canal Right  Horizontal  Canal Right Duration 0  Horizontal Canal Right Symptoms Other (comment) (continuous unchanging nystagmus and symptoms)                                 Cognition Arousal/Alertness: Lethargic Behavior During Therapy: Flat affect Overall Cognitive Status: Within Functional Limits for tasks assessed                                 General Comments: Needs significant encouragement to work with PT.              Pertinent Vitals/Pain Pain Assessment: No/denies pain           PT Goals (current goals can now be found in the care plan section) Acute Rehab PT Goals Patient Stated Goal: to go home Progress towards PT goals: Not progressing toward goals - comment(pt is unable to tolerate being upright.)    Frequency    Min 4X/week      PT Plan Current plan remains appropriate;Frequency needs to be updated       AM-PAC PT "6 Clicks" Daily Activity  Outcome Measure  Difficulty turning over in bed (including adjusting bedclothes, sheets and blankets)?: A Little Difficulty moving from lying on back to sitting on the side of the bed? : Unable Difficulty sitting down on and standing up from a chair with arms (e.g., wheelchair, bedside commode, etc,.)?: Unable Help needed moving to and from a bed to chair (including a wheelchair)?: Total Help needed walking in hospital room?: Total Help needed climbing 3-5 steps with a railing? : Total 6 Click Score: 8    End of Session Equipment Utilized During Treatment: Oxygen Activity Tolerance: Patient limited by fatigue;Other (comment)(limited by nausea and dizziness-constant) Patient left: in bed;with call bell/phone within reach;with family/visitor present Nurse Communication: Mobility status;Other (comment)(asked for tilt bed order) PT Visit Diagnosis: Unsteadiness on feet (R26.81);Muscle weakness (generalized) (M62.81);BPPV;Dizziness and giddiness (R42)     Time: 1501-1601 PT Time Calculation (min) (ACUTE  ONLY): 60 min  Charges:  $Therapeutic Activity: 23-37 mins          Aniello Christopoulos B. [place], [place], DPT [phone]            05/06/2018, 4:45 PM

## 2018-05-07 DIAGNOSIS — R918 Other nonspecific abnormal finding of lung field: Secondary | ICD-10-CM

## 2018-05-07 LAB — CBC
HCT: 45 % (ref 39.0–52.0)
Hemoglobin: 15.6 g/dL (ref 13.0–17.0)
MCH: 33.8 pg (ref 26.0–34.0)
MCHC: 34.7 g/dL (ref 30.0–36.0)
MCV: 97.4 fL (ref 78.0–100.0)
PLATELETS: 183 10*3/uL (ref 150–400)
RBC: 4.62 MIL/uL (ref 4.22–5.81)
RDW: 12.6 % (ref 11.5–15.5)
WBC: 8 10*3/uL (ref 4.0–10.5)

## 2018-05-07 LAB — RENAL FUNCTION PANEL
Albumin: 2.7 g/dL — ABNORMAL LOW (ref 3.5–5.0)
Anion gap: 3 — ABNORMAL LOW (ref 5–15)
BUN: 12 mg/dL (ref 6–20)
CALCIUM: 8.4 mg/dL — AB (ref 8.9–10.3)
CO2: 29 mmol/L (ref 22–32)
CREATININE: 1.07 mg/dL (ref 0.61–1.24)
Chloride: 108 mmol/L (ref 101–111)
GFR calc non Af Amer: >60 mL/min (ref >60)
Glucose, Bld: 112 mg/dL — ABNORMAL HIGH (ref 65–99)
Phosphorus: 2.6 mg/dL (ref 2.5–4.6)
Potassium: 3.7 mmol/L (ref 3.5–5.1)
SODIUM: 140 mmol/L (ref 135–145)

## 2018-05-07 NOTE — Progress Notes (Signed)
Medicine attending: I evaluated this patient today together with resident physician Dr. Guadlupe Spanish and I concur with his management plan which we discussed together. We appreciate neurology input.  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  He does not feel this is related to the previously noted cerebellar infarcts on MRI or his small vessel disease. We discussed options.  I would favor the less toxic option of a trial of intravenous immunoglobulin rather than megadose steroids for this elderly man.  I would also favor waiting until we have results of the lung biopsy tomorrow for better understanding of what subtype of lung cancer we are dealing with.  If this is small cell lung cancer, then treatment of the primary disease with chemotherapy should reverse the paraneoplastic syndrome without need for additional treatment.  If it is non-small cell lung cancer, treatment results are more modest and then I would proceed with a trial of intravenous immunoglobulin pending further definitive treatment of his lung cancer. Patient son present at the bedside.  Status and management plan reviewed.

## 2018-05-07 NOTE — Progress Notes (Signed)
Subjective: cough with continue improvement on room air this morning, not SOB.  He is still anxious for an answer. Discussed with patient neurologist's impression.  Objective:  Vital signs in last 24 hours: Vitals:   05/06/18 1255 05/06/18 2121 05/07/18 0500 05/07/18 0802  BP: (!) 145/51 (!) 149/72 (!) 145/72   Pulse: 64 66 69 71  Resp:  16 18 16   Temp: 97.6 F (36.4 C) 98.7 F (37.1 C) (!) 97.3 F (36.3 C)   TempSrc: Axillary Oral Oral   SpO2: 97% 93% 90% 93%  Weight:      Height:       Physical Exam  Constitutional: He appears well-developed and well-nourished.  Eyes: Right eye exhibits no discharge. Left eye exhibits no discharge. No scleral icterus.  Cardiovascular: Normal rate, regular rhythm, normal heart sounds and intact distal pulses. Exam reveals no gallop and no friction rub.  No murmur heard. Pulmonary/Chest: Effort normal. No respiratory distress. He has no wheezes. He has no rales.  Pt was somewhat upset today did not listen to posterior lung fields, anterior and lateral fields clear  Abdominal: Soft. Bowel sounds are normal. He exhibits no distension and no mass. There is no tenderness. There is no guarding.  Musculoskeletal: He exhibits no edema (no LE edema).  Neurological: He is alert.  Horizontal nystagmus seen, worse with leaning forward. Just examined by neurology    Assessment/Plan:  Principal Problem:   Acute vestibular syndrome Active Problems:   Nausea and vomiting   Atrial fibrillation (HCC)   Vertigo   Mass of upper lobe of right lung   Panlobular emphysema (HCC)   Nystagmus  Reginald [name] is a 77 yo with PMH of AAA (s/p stent in 2016), COPD, Asthma, DVT (2005), and BPH who presented with vertigo, nausea, and vomiting. He was admitted to the internal medicine teaching service for management. 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 Will continue vestibular PT cannot see pt again until 6/3. Neuroradiologist concerned this is a permanent process due to progressive vascular disease.  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  At this stage in diagnosis and given the patient's age and comorbidity we will opt for IVIG therapy.     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  Afebrile overnight  -white count and cough improving -continuing unasyn   New onset Atrial Fibrillation: Patient developed new onset A-Fib  while working with vestibular PT. Prior to A-Fib patient's HR  in the 50s. Patient currently remains in A-Fib with rates stable in the 80s-90s at rest. TSH and blood work within normal limits. Pt started on eliquis. ECHO normal EF, looks good for pts age  -still in NSR -holding eliquis for biopsy -diltiazem 24hr 120mg  daily -PRN metoprolol for HR >140   COPD/Asthma: Does not seem to be in exacerbation  based on exam, O2 requirement is almost certainly due to pneumonia.  On rescue inhaler and flovent at home  -continue to monitor -duonebs PRN Q8  BPH: Chronic, stable. Continue home doxazosin and finasteride daily  Code Status: Full  Dispo: Anticipated discharge in approximately 3-4 day(s).   [name], MD 05/07/2018, 9:30 AM [name]  MD PGY-1 Internal Medicine Pager # [phone]

## 2018-05-07 NOTE — Progress Notes (Signed)
Physical Therapy Treatment Patient Details Name: Reginald [name] MRN: [id number] DOB: Dec 21, 1940 Today's Date: 05/07/2018    History of Present Illness Mr Decesare is a 68 to M with Hx of AAA (s/p stent in 2016), COPD, Asthma, DVT (2005), and R heel fx who presented with nausea and vomiting for several days PTA. Vertigo is thought to be BPPV vs progressive cerebellar dysfunction vs paraneoplatic syndrome, newly dx lung mass due for bx on 05/08/18, L LL PNA, and new onset A-fib.  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      PT Comments    Pt tolerated tilt bed at ~45 degrees today for ~8 mins.  HR up to 110s, but not in A-fib, and BP elevated, but stable.  He did start to have increased nausea toward the end of the tilt, and kept his eyes closed throughout the session to try to limit how much nausea was triggered by visual motion.  [name] sons present and helpful/hands on throughout the session.  Son, Wilfred Lacy asked about hospice consult at the end of our session and I told him I would ask the team (paged MD).  PT will continue to follow acutely to help mobilize [name] as best we can and as much as he can tolerate.     Follow Up Recommendations  SNF     Equipment Recommendations  Hospital bed;Wheelchair (measurements PT);Wheelchair cushion (measurements PT);Other (comment)(hoyer lift)    Recommendations for Other Services   NA     Precautions / Restrictions Precautions Precautions: Fall    Mobility  Bed Mobility Overal bed mobility: Needs Assistance Bed Mobility: Rolling Rolling: Min assist         General bed mobility comments: Rolled bil for gown change and linen change due to leaking condom catheter.  Pt using railing, moving slowly, but mostly on his own power.   Transfers                 General transfer comment: Used tilt bed today to get upright.             Cognition Arousal/Alertness:  Lethargic Behavior During Therapy: Flat affect Overall Cognitive Status: Within Functional Limits for tasks assessed                                           General Comments General comments (skin integrity, edema, etc.): Got tilt bed up to 45 degrees for ~8 mins HR up to 112 from the 80s, but not in A-fib.  [name] BP elevated, but stable in semi standing.  Worked on terminal knee extension and just WB through both legs.  3/4 dizziness/nausea at rest, 5/10 dizziness after tilting.        Pertinent Vitals/Pain Pain Assessment: No/denies pain           PT Goals (current goals can now be found in the care plan section) Acute Rehab PT Goals Patient Stated Goal: to go home, and to have some of the neurologic symptoms ease. Progress towards PT goals: Progressing toward goals    Frequency    Min 4X/week      PT Plan Current plan remains appropriate    Co-evaluation PT/OT/SLP Co-Evaluation/Treatment: Yes Reason for Co-Treatment: Complexity of the patient's impairments (multi-system involvement);For patient/therapist safety;To address functional/ADL transfers PT goals addressed during session: Mobility/safety with mobility;Strengthening/ROM  AM-PAC PT "6 Clicks" Daily Activity  Outcome Measure  Difficulty turning over in bed (including adjusting bedclothes, sheets and blankets)?: A Little Difficulty moving from lying on back to sitting on the side of the bed? : Unable Difficulty sitting down on and standing up from a chair with arms (e.g., wheelchair, bedside commode, etc,.)?: Unable Help needed moving to and from a bed to chair (including a wheelchair)?: Total Help needed walking in hospital room?: Total Help needed climbing 3-5 steps with a railing? : Total 6 Click Score: 8    End of Session Equipment Utilized During Treatment: Oxygen Activity Tolerance: Patient limited by fatigue;Other (comment)(limited by nausea and dizziness) Patient left: in  bed;with call bell/phone within reach;with family/visitor present   PT Visit Diagnosis: Unsteadiness on feet (R26.81);Muscle weakness (generalized) (M62.81);Dizziness and giddiness (R42)     Time: 0301-3143 PT Time Calculation (min) (ACUTE ONLY): 46 min  Charges:  $Therapeutic Activity: 23-37 mins          Kattia Selley B. [place], [place], DPT [phone]            05/07/2018, 1:00 PM

## 2018-05-07 NOTE — Progress Notes (Signed)
  Speech Language Pathology Treatment: Dysphagia  Patient Details Name: Reginald [name] MRN: [id number] DOB: 15-Jul-1941 Today's Date: 05/07/2018 Time: 9021-1155 SLP Time Calculation (min) (ACUTE ONLY): 20 min  Assessment / Plan / Recommendation Clinical Impression  Pt consumed soft solids and thin liquids without overt signs of dysphagia or aspiration using aspiration precautions with Mod I. SLP reinforced education about MBS results and the importance of using swallowing precautions. Pt and son deny any difficulty with intake and his son even says that pt's amount of intake has greatly increased. Would continue with unrestricted diet textures. No further acute SLP f/u indicated for dysphagia. Please reorder SLP with any acute changes.   HPI HPI: Reginald [name] is a 77 yo with PMH of AAA (s/p stent in 2016), COPD, Asthma, DVT (2005), and BPH who presented with vertigo, nausea, and vomiting. MD noting concern for aspiration PNA, lungs with rhonchi and productive cough. 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 F/u chest CT results pending. MRI 04/29/18 with no acute findings, old cerebellar infarcts. Repeat imaging is being considered. Per PT notes, pt with new sudden onset opsiclonus-type eye movements, with ? medication side effect vs neurodegenerative etiology. MBSS scheduled following question of clinical s/s of dysphagia with thin liquid and likely LLL pna      SLP Plan  All goals met       Recommendations  Diet recommendations: Regular;Thin liquid Liquids provided via: Cup;Straw Medication Administration: Whole meds with puree Supervision: Patient able to self feed Compensations: Slow rate;Small sips/bites;Follow solids with liquid Postural Changes and/or Swallow Maneuvers: Seated upright 90 degrees                Oral Care Recommendations: Oral care BID Follow up Recommendations: None SLP Visit Diagnosis: Dysphagia, unspecified  (R13.10) Plan: All goals met       GO                Germain Osgood 05/07/2018, 4:52 PM  Germain Osgood, M.A. CCC-SLP [phone]

## 2018-05-07 NOTE — Consult Note (Signed)
NEURO HOSPITALIST CONSULT NOTE   Requestig physician: Dr. Beryle Beams  Reason for Consult: Vertigo/BPPV  History obtained from:  Patient and Family  HPI:                                                                                                                                          Reginald [name] is an 77 y.o. male with PMH Hyperlipidemia, DVT, AAA s/p stent 2016, new onset afib, who presented to [facility] ED on 5/28 with a 3 day history of nausea/vomiting and vertigo. Patient initially thought to have severe BPPV dix-hall-pike and epley maneuvers performed with short term relief, meclizine also did not provide much relief. MRI w/o contrast obtained and Neurology consulted.  Per patient and son:  This all began about 3-4 days prior to 04/29/18 when patient suddenly began  having nausea with vomiting and vertigo. 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 Patient was unable to keep fluids down, became weak so they called EMS and patient was brought to [facility]. Patient states that closing his eyes makes it a little better, but nothing makes it disappear. He has not noticed that lying on right side vs left side makes it any better. Denies CP, HA, SOB, weakness, numbness or tingling. Patient reports that with any movement he has tremors, but it is worse when he tries to sit up at the side of the bed. Patient has not walked in 8 days d/t tremors.   Past Medical History:  Diagnosis Date  . AAA (abdominal aortic aneurysm) ([place])   . Arthritis   . Asthma   . COPD (chronic obstructive pulmonary disease) ([place])   . DVT (deep venous thrombosis) ([place]) 2005   Right  leg  . Headache    cluster headaches  . Hyperlipidemia   . Joint pain   . Shortness of breath dyspnea    exertion  . Vertigo 04/29/2018    Past Surgical History:  Procedure Laterality Date  . ABDOMINAL AORTIC ENDOVASCULAR STENT GRAFT N/A  01/26/2015   Procedure: ABDOMINAL AORTIC ENDOVASCULAR STENT GRAFT;  Surgeon: Elam Dutch, MD;  Location: [facility];  Service: Vascular;  Laterality: N/A;  . FRACTURE SURGERY  2005   Right heel - Fx and NO SURGERY    Family History  Problem Relation Age of Onset  . Hyperlipidemia Mother   . Hypertension Mother   . Cancer Father        Bladder  . Hyperlipidemia Father   . Hypertension Father   . Kidney disease Father   . Heart disease Brother        Heart Disease before age 37  . Heart attack Brother  x's 2  . Hyperlipidemia Brother   . Hypertension Brother   . Hyperlipidemia Sister   . Hypertension Sister     Social History:  reports that he quit smoking about 6 years ago. His smoking use included e-cigarettes and cigarettes. He has a 30.00 pack-year smoking history. He has never used smokeless tobacco. He reports that he does not drink alcohol or use drugs. He does still report Vape usage.  No Known Allergies  MEDICATIONS:                                                                                                                     Scheduled: . budesonide (PULMICORT) nebulizer solution  0.25 mg Nebulization Daily  . diltiazem  120 mg Oral Daily  . doxazosin  4 mg Oral QHS  . feeding supplement (ENSURE ENLIVE)  237 mL Oral BID BM  . finasteride  5 mg Oral QHS  . mouth rinse  15 mL Mouth Rinse BID  . nystatin  5 mL Oral QID   Continuous: . ampicillin-sulbactam (UNASYN) IV Stopped (05/07/18 0545)   HFW:YOVZCHYIFOYDX **OR** acetaminophen, ipratropium-albuterol, meclizine, metoprolol tartrate, ondansetron (ZOFRAN) IV   ROS:                                                                                                                                       History obtained from the patient/ family  General ROS: negative for - chills, fatigue, fever, night sweats, weight gain or weight loss Ophthalmic AJO:INOMVEHM for nystagmus and room spinning. Endocrine ROS:  negative for - galactorrhea, hair pattern changes, polydipsia/polyuria or temperature intolerance Respiratory ROS: negative for - cough, hemoptysis, shortness of breath or wheezing Cardiovascular ROS: negative for - chest pain, dyspnea on exertion, edema or irregular heartbeat Gastrointestinal CNO:BSJGGEZM for  nausea/vomiting  Genito-Urinary ROS: positive for foley Musculoskeletal ROS: negative for - joint swelling or muscular weakness Neurological ROS: as noted in HPI Dermatological ROS: negative for rash and skin lesion changes   Blood pressure (!) 145/72, pulse 71, temperature (!) 97.3 F (36.3 C), temperature source Oral, resp. rate 16, height 5\' 10"  (1.778 m), weight 95.6 kg (210 lb 12.2 oz), SpO2 93 %.   General Examination:  Physical Exam  HEENT-  Normocephalic, no lesions, without obvious abnormality.  Normal external eye and conjunctiva.  Left fast beating nystagmus. Head tremors match rhythm of nystagmus. Cardiovascular-  pulses palpable throughout   Lungs- no excessive working breathing.  Saturations within normal limits Extremities- Warm, dry and intact Musculoskeletal-no joint tenderness, deformity or swelling Skin-warm and dry, no hyperpigmentation, vitiligo, or suspicious lesions  Neurological Examination Mental Status: Alert, oriented, patient in bed eyes closed, NAD.  Speech fluent without evidence of aphasia.  Able to follow simple commands. Cranial Nerves: Visual fields grossly normal. PERRL. Ptosis not present, extra-ocular motions are full bilaterally. There is prominent horizontal pendular nystagmus with central gaze, which waxes and wanes somewhat. Nystagmus is also prominent on upward gaze, less prominent with leftward and downward gaze. Smile symmetric, edentulous, facial light touch sensation normal bilaterally, hearing normal bilaterally, bilateral trapezii no  asymmetry, midline tongue extension. Motor: Right : Upper extremity   5/5    Left:     Upper extremity   5/5  Lower extremity   5/5     Lower extremity   5/5 Action Tremors noted with upper extremity movements.  Tone and bulk:normal tone throughout; no atrophy noted. Sensory:  Light touch intact throughout, bilaterally Deep Tendon Reflexes: 2+ and symmetric throughout Plantars: Right: downgoing  Left: downgoing Cerebellar: Mild ataxia with FNF bilaterally. Prominent action tremor also noted bilaterally; normal heel-to-shin testing bilaterally.  Gait: not tested d/t tremors reported upon sitting position    Lab Results: Basic Metabolic Panel: Recent Labs  Lab 05/02/18 0533 05/04/18 0707 05/05/18 0725 05/07/18 0626  NA 136 141 145 140  K 3.1* 3.0* 3.3* 3.7  CL 98* 107 108 108  CO2 26 26 29 29   GLUCOSE 124* 147* 121* 112*  BUN 22* 21* 16 12  CREATININE 1.09 1.05 1.05 1.07  CALCIUM 9.1 8.8* 8.7* 8.4*  MG 2.3  --  2.3  --   PHOS  --  2.3* 2.5 2.6    CBC: Recent Labs  Lab 05/04/18 0707 05/05/18 0725 05/06/18 0338 05/07/18 0626  WBC 11.1* 9.9 9.4 8.0  NEUTROABS 9.5*  --   --   --   HGB 15.8 15.2 14.7 15.6  HCT 45.8 43.9 42.6 45.0  MCV 96.2 98.2 97.9 97.4  PLT 195 174 179 183    Cardiac Enzymes: No results for input(s): CKTOTAL, CKMB, CKMBINDEX, TROPONINI in the last 168 hours.  Lipid Panel: No results for input(s): CHOL, TRIG, HDL, CHOLHDL, VLDL, LDLCALC in the last 168 hours.  Imaging:  Ct head 04/29/18:  No acute finding by CT. Advanced chronic appearing small vessel ischemic changes of the cerebral hemispheric white matter, somewhat progressive since 2013. 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  MRI BRAIN w/o contrast 04/29/18: 1. No acute intracranial process. 2. 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 3. Severe chronic small vessel ischemic changes. Old small cerebellar infarcts.  CT chest w/  contrast: 1. There is a mass in the periphery of the right upper lobe posteriorly measuring 5.2 x 2.7 cm abutting the pleura without identified chest wall invasion. This mass is concerning for malignancy. There is 1 abnormal node in the mediastinum as well as a borderline node in the right hilum. I suspect at least 1 of these nodes is likely metastatic. Recommend a PET-CT for evaluation of the lung mass and lymph nodes. The mass will likely ultimately require tissue sampling for diagnosis. 2. Probable pneumonia in the left lung base. There  is also a component of atelectasis. Recommend follow-up to resolution. 3. Atherosclerotic changes in the thoracic aorta. 4. Emphysematous changes in the lungs.  History and examination documented by Laurey Morale, MSN, NP-C, [facility] [phone]  Impression:  77 year old caucasian male with PMH Hyperlipidemia, DVT, AAA s/p stent 2016, new onset afib, COPD who presented to [facility] ED on 5/28 with a 3 day history of nausea/vomiting and vertigo. Initially thought to have BPPV dix-hall pike and Epley Maneuvers performed with some improvement of symptoms that then completely returned. MRI brain and head CT negative for acute stroke. Chest CT found RUL mass concerning for malignancy. Based upon the exam findings, the prominent nystagmus appears less likely to be secondary to BPPV, and more concerning for a paraneoplastic etiology. Recommend IVIG x 5 days as empiric treatment for the prominent pendular nystagmus.  Exam findings best localize to the brainstem +/- diffuse cerebellar involvement. Lack of additional brainstem findings militates against a brainstem stroke, but militates for a highly anatomically specific focal lesion, as may be seen with an antibody-mediated paraneoplastic process.      Recommendations: 1) Empirically treat with IVIG 0.4 g/kg x 5 days and monitor for improvement of ocular symptoms and signs 2) Agree with primary team's  decision to order anti-Yo antibody.  3) Agree with obtaining tumor biopsy. If his ocular condition is paraneoplastic, it may improve with total resection of the lung tumor.  4) Neurology will continue to follow  I have seen and examined the patient. I have amended the assessment/recommendations above.  Electronically signed: Dr. Kerney Elbe 05/07/2018, 8:41 AM

## 2018-05-07 NOTE — Progress Notes (Signed)
Occupational Therapy Evaluation Patient Details Name: Reginald [name] MRN: [id number] DOB: 09/06/1941 Today's Date: 05/07/2018    History of Present Illness Reginald [name] is a 74 to M with Hx of AAA (s/p stent in 2016), COPD, Asthma, DVT (2005), and R heel fx who presented with nausea and vomiting for several days PTA. Vertigo is thought to be BPPV vs progressive cerebellar dysfunction vs paraneoplatic syndrome, newly dx lung mass due for bx on 05/08/18, L LL PNA, and new onset A-fib.  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     Clinical Impression   PTA, pt independent with ADL and mobility and was active in the community. Pt presents with significant functional status change, requiring total A with ADL due to deficits listed below. Session completed as cotreat with PT to begin use of tilt bed with eyes closed. Able to tolerate 45 degree tilt with stable vitals, although complaining of increased nausea with standing after 8 min. At this time recommend rehab at [facility]. If pt begins to progress pending treatment, he may become more appropriate for CIR. Recommend palliative medicine consult to assist with goals of care and management of comfort/family education and support. Will follow acutely.     Follow Up Recommendations  SNF;Supervision/Assistance - 24 hour    Equipment Recommendations  Hospital bed;Wheelchair (measurements OT);Wheelchair cushion (measurements OT);3 in 1 bedside commode;Tub/shower bench    Recommendations for Other Services Other (comment)(Palliative Consult)     Precautions / Restrictions Precautions Precautions: Fall Precaution Comments: nausea/movement sensitive Restrictions Weight Bearing Restrictions: No      Mobility Bed Mobility Overal bed mobility: Needs Assistance Bed Mobility: Rolling Rolling: Min assist         General bed mobility comments: Rolled bil for gown change and linen change due  to leaking condom catheter.  Pt using railing, moving slowly, but mostly on his own power. Heavy use of rails; keeps eye closed during mobility which seems to help with symptoms  Transfers                 General transfer comment: Used tilt bed today to get upright.     Balance         Postural control: (forward pitch.  )   Standing balance-Leahy Scale: Poor Standing balance comment: support with tilt bed suports; head extended                           ADL either performed or assessed with clinical judgement   ADL Overall ADL's : Needs assistance/impaired                                     Functional mobility during ADLs: Maximal assistance;+2 for physical assistance(limited mobility to tilt bed) General ADL Comments: total A at this time with all ADL; Pt able to complete hand to mouth movement with BUE when trunk is supported. Began educaiton with sons on using hand over hand technique when self feeding and having trunk and BUE supported. Pt would like to be able to feed himself     Vision Baseline Vision/History: Wears glasses Wears Glasses: At all times Patient Visual Report: Blurring of vision Vision Assessment?: Vision impaired- to be further tested in functional context Additional Comments: Pt unable to stabilize gaze due to rapid uncontrolled, involuntary multivectorial eye movement. "  Sometimes I see one of you and sometimes i see 6 of you". Eye patch appares to help diminish syptoms; Eye movements present with eyes closed but this improves feeling of nausea.     Perception     Praxis      Pertinent Vitals/Pain Pain Assessment: No/denies pain     Hand Dominance Right   Extremity/Trunk Assessment Upper Extremity Assessment Upper Extremity Assessment: Generalized weakness(appears ataxic BUE; will further assess)   Lower Extremity Assessment Lower Extremity Assessment: Generalized weakness   Cervical / Trunk  Assessment Cervical / Trunk Assessment: Kyphotic   Communication Communication Communication: No difficulties   Cognition Arousal/Alertness: Lethargic Behavior During Therapy: Flat affect Overall Cognitive Status: Within Functional Limits for tasks assessed                                     General Comments  Able to tolerate @ 45 degrees tilt bed @ 8 min; complaining of "not feeling well". 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 and he was helping her      Prior Functioning/Environment Level of Independence: Independent                 OT Problem List: Decreased strength;Decreased activity tolerance;Impaired balance (sitting and/or standing);Impaired vision/perception;Decreased coordination;Decreased safety awareness;Decreased knowledge of use of DME or AE;Decreased knowledge of precautions;Cardiopulmonary status limiting activity;Impaired sensation;Impaired UE functional use      OT Treatment/Interventions: Self-care/ADL training;Therapeutic exercise;Neuromuscular education;DME and/or AE instruction;Therapeutic activities;Visual/perceptual remediation/compensation;Patient/family education;Balance training    OT Goals(Current goals can be found in the care plan section) Acute Rehab OT Goals Patient Stated Goal: to go home, and to have some of the neurologic  symptoms ease. OT Goal Formulation: With patient Time For Goal Achievement: 05/21/18 Potential to Achieve Goals: Good  OT Frequency: Min 2X/week   Barriers to D/C:            Co-evaluation PT/OT/SLP Co-Evaluation/Treatment: Yes Reason for Co-Treatment: Complexity of the patient's impairments (multi-system involvement);For patient/therapist safety PT goals addressed during session: Mobility/safety with mobility;Strengthening/ROM OT goals addressed during session: ADL's and self-care      AM-PAC PT "6 Clicks" Daily Activity     Outcome Measure Help from another person eating meals?: Total Help from another person taking care of personal grooming?: Total Help from another person toileting, which includes using toliet, bedpan, or urinal?: Total Help from another person bathing (including washing, rinsing, drying)?: Total Help from another person to put on and taking off regular upper body clothing?: Total Help from another person to put on and taking off regular lower body clothing?: Total 6 Click Score: 6   End of Session Equipment Utilized During Treatment: Other (comment)(tilt bed) Nurse Communication: Mobility status  Activity Tolerance: Patient tolerated treatment well Patient left: in bed;with call bell/phone within reach;with family/visitor present  OT Visit Diagnosis: Other abnormalities of gait and mobility (R26.89);Muscle weakness (generalized) (M62.81);Low vision, both eyes (H54.2);Ataxia, unspecified (R27.0);Dizziness and giddiness (R42)                Time: 8466-5993 OT Time Calculation (min): 44 min Charges:  OT General Charges $OT Visit: 1 Visit OT Evaluation $OT Eval Moderate Complexity: 1 Mod G-Codes:     Maurie Boettcher, OT/L  OT Clinical Specialist [phone]   [facility] 05/07/2018, 2:53 PM

## 2018-05-08 DIAGNOSIS — Z7401 Bed confinement status: Secondary | ICD-10-CM

## 2018-05-08 MED ORDER — LACTULOSE 10 GM/15ML PO SOLN: 20.0000 g | Freq: Once | ORAL | Status: DC

## 2018-05-08 MED ORDER — SENNOSIDES-DOCUSATE SODIUM 8.6-50 MG PO TABS
1.0000 | ORAL_TABLET | Freq: Two times a day (BID) | ORAL | Status: DC | PRN
  Administered 2018-05-14: 1 via ORAL
  Filled 2018-05-08: qty 1

## 2018-05-08 MED ORDER — POLYETHYLENE GLYCOL 3350 17 G PO PACK: 17.0000 g | PACK | Freq: Every day | ORAL | Status: DC | PRN

## 2018-05-08 MED ORDER — LACTULOSE 10 GM/15ML PO SOLN
20.0000 g | Freq: Once | ORAL | Status: AC
  Administered 2018-05-08: 20 g via ORAL
  Filled 2018-05-08: qty 30

## 2018-05-08 NOTE — Progress Notes (Signed)
PT Cancellation Note  Patient Details Name: CECILE GUEVARA MRN: [id number] DOB: 01-12-1941   Cancelled Treatment:    Reason Eval/Treat Not Completed: Other (comment).  Pt is anxiously awaiting his bx today and has not eaten since yesterday evening.  He is feeling ill and would like to refrain from aggressive therapy today.   Thanks,    Barbarann Ehlers. [place], [place], DPT [phone]   05/08/2018, 2:12 PM

## 2018-05-08 NOTE — Progress Notes (Signed)
Patient's son asked CSW about Hospice services. CSW explained that Medicare will not pay for both hospice and rehab. Patient can go to SNF with palliative or home with hospice. Patient's son stated that he wants patient to get back on his feet. CSW explained that the goal of hospice is comfort and patient would not receive therapy. CSW will continue to follow.   Percell Locus Harolyn Cocker LCSW [phone]

## 2018-05-08 NOTE — Progress Notes (Signed)
Dr Berneice Gandy notified patient had not had a BM since 5/29. Orders for Senna and Mirilax placed by MD. Reubin Milan give Meds due to patient been NPO with sips . Patient able to take meds only with applesauce.

## 2018-05-08 NOTE — Progress Notes (Signed)
Medicine attending: I examined this patient today together with resident physician Dr. Guadlupe Spanish and I concur with his evaluation and management plan. No acute clinical change.  Essentially bed ridden in view of severe nystagmus.  Compromised pulmonary function.  Comfortable at rest. He was supposed to get a transcutaneous needle biopsy of the right lung mass today but unfortunately due to an emergency procedure on another patient he had to be rescheduled. His oldest son is here today.  We empathized with him with respect to his father's condition and the length of time it is taking to establish a diagnosis.  We reviewed our clinical impressions and management plan and he was comfortable with this. We will continue the current management plan.  Symptom control pending definitive diagnosis and then outline a treatment plan.

## 2018-05-08 NOTE — Progress Notes (Signed)
Subjective: No coughing not sob, spoke with pts other son today about plan for biopsy today and further answers and help discerning cause of vertigo    Objective:  Vital signs in last 24 hours: Vitals:   05/07/18 0802 05/07/18 1518 05/07/18 2145 05/08/18 0607  BP:  (!) 148/70 (!) 159/88 (!) 160/82  Pulse: 71 78 75 71  Resp: 16 18 12 20   Temp:  98.8 F (37.1 C) 98 F (36.7 C) 98 F (36.7 C)  TempSrc:  Oral    SpO2: 93% 93% 98% 95%  Weight:      Height:       Physical Exam  Constitutional: He appears well-developed and well-nourished.  Eyes: Right eye exhibits no discharge. Left eye exhibits no discharge. No scleral icterus.  Cardiovascular: Normal rate, regular rhythm, normal heart sounds and intact distal pulses. Exam reveals no gallop and no friction rub.  No murmur heard. Pulmonary/Chest: Effort normal. No respiratory distress. He has no wheezes. He has no rales.  Increased air movement in LMF and LLF still slightly decreased compared with R  Abdominal: Soft. Bowel sounds are normal. He exhibits no distension and no mass. There is no tenderness. There is no guarding.  Musculoskeletal: He exhibits no edema (no LE edema).  Neurological: He is alert.  Horizontal nystagmus seen, pt laying on left side    Assessment/Plan:  Principal Problem:   Acute vestibular syndrome Active Problems:   Nausea and vomiting   Atrial fibrillation (HCC)   Vertigo   Mass of upper lobe of right lung   Panlobular emphysema (HCC)   Nystagmus  Reginald [name] is a 77 yo with PMH of AAA (s/p stent in 2016), COPD, Asthma, DVT (2005), and BPH who presented with vertigo, nausea, and vomiting. He was admitted to the internal medicine teaching service for management. The specific problems addressed during admission are as follows:  Vertigo: BPPV vs progressive cerebellar dysfunction vs paraneoplastic syndrome dizziness and nausea/vomiting significantly improved after vestibular PT on HD#1 but have  since returned and remains severe. Will continue vestibular PT cannot see pt again until 6/3. Neuroradiologist concerned this is a permanent process due to progressive vascular disease.  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  Afebrile overnight  -continue unasyn for 7 day course ends 6/8   New onset Atrial Fibrillation: Patient developed new onset A-Fib  while working with vestibular PT. Prior to A-Fib patient's HR  in the 50s. Patient currently remains in A-Fib with rates stable in the 80s-90s at rest. TSH and blood work within normal limits. Pt started on eliquis. ECHO normal EF, looks good for pts age  -still in NSR -holding eliquis for biopsy -diltiazem 24hr 120mg  daily -PRN metoprolol for HR >140   COPD/Asthma: Does not seem to be in exacerbation based on exam, O2 requirement is almost certainly due to pneumonia.  On rescue inhaler and flovent at home  -continue to monitor -duonebs PRN Q8  BPH: Chronic, stable. Continue home doxazosin and finasteride daily  Code Status: Full  Dispo: Anticipated discharge in approximately 3-5 day(s).   [name], MD 05/08/2018, 10:16 AM [name] MD PGY-1 Internal Medicine Pager # [phone]

## 2018-05-09 ENCOUNTER — Inpatient Hospital Stay (HOSPITAL_COMMUNITY): Payer: Medicare Other

## 2018-05-09 DIAGNOSIS — Z9889 Other specified postprocedural states: Secondary | ICD-10-CM

## 2018-05-09 LAB — RENAL FUNCTION PANEL
Albumin: 2.8 g/dL — ABNORMAL LOW (ref 3.5–5.0)
Anion gap: 5 (ref 5–15)
BUN: 15 mg/dL (ref 6–20)
CHLORIDE: 106 mmol/L (ref 101–111)
CO2: 28 mmol/L (ref 22–32)
Calcium: 8.6 mg/dL — ABNORMAL LOW (ref 8.9–10.3)
Creatinine, Ser: 0.94 mg/dL (ref 0.61–1.24)
GFR calc Af Amer: >60 mL/min (ref >60)
Glucose, Bld: 121 mg/dL — ABNORMAL HIGH (ref 65–99)
POTASSIUM: 3.7 mmol/L (ref 3.5–5.1)
Phosphorus: 2.6 mg/dL (ref 2.5–4.6)
Sodium: 139 mmol/L (ref 135–145)

## 2018-05-09 LAB — CBC
HEMATOCRIT: 44.3 % (ref 39.0–52.0)
Hemoglobin: 15.5 g/dL (ref 13.0–17.0)
MCH: 33.8 pg (ref 26.0–34.0)
MCHC: 35 g/dL (ref 30.0–36.0)
MCV: 96.5 fL (ref 78.0–100.0)
Platelets: 199 10*3/uL (ref 150–400)
RBC: 4.59 MIL/uL (ref 4.22–5.81)
RDW: 12.7 % (ref 11.5–15.5)
WBC: 8.9 10*3/uL (ref 4.0–10.5)

## 2018-05-09 MED ORDER — CALCIUM CARBONATE ANTACID 500 MG PO CHEW
1.0000 | CHEWABLE_TABLET | Freq: Three times a day (TID) | ORAL | Status: DC | PRN
  Administered 2018-05-10: 400 mg via ORAL
  Filled 2018-05-09: qty 2

## 2018-05-09 MED ORDER — MIDAZOLAM HCL 2 MG/2ML IJ SOLN
INTRAMUSCULAR | Status: AC
  Filled 2018-05-09: qty 4

## 2018-05-09 MED ORDER — LIDOCAINE HCL 1 % IJ SOLN
INTRAMUSCULAR | Status: AC
  Filled 2018-05-09: qty 20

## 2018-05-09 MED ORDER — MIDAZOLAM HCL 2 MG/2ML IJ SOLN
INTRAMUSCULAR | Status: AC | PRN
  Administered 2018-05-09: 1 mg via INTRAVENOUS

## 2018-05-09 MED ORDER — ACETAMINOPHEN 500 MG PO TABS
1000.0000 mg | ORAL_TABLET | Freq: Once | ORAL | Status: AC
  Administered 2018-05-09: 1000 mg via ORAL
  Filled 2018-05-09: qty 2

## 2018-05-09 MED ORDER — METHYLPREDNISOLONE SODIUM SUCC 125 MG IJ SOLR
60.0000 mg | Freq: Once | INTRAMUSCULAR | Status: AC
  Administered 2018-05-09: 60 mg via INTRAVENOUS
  Filled 2018-05-09: qty 2

## 2018-05-09 MED ORDER — FENTANYL CITRATE (PF) 100 MCG/2ML IJ SOLN
INTRAMUSCULAR | Status: AC | PRN
  Administered 2018-05-09: 25 ug via INTRAVENOUS

## 2018-05-09 MED ORDER — IMMUNE GLOBULIN (HUMAN) 5 GM/50ML IV SOLN
1.0000 g/kg | INTRAVENOUS | Status: AC
  Administered 2018-05-09 – 2018-05-10 (×2): 95 g via INTRAVENOUS
  Filled 2018-05-09 (×2): qty 50

## 2018-05-09 MED ORDER — FENTANYL CITRATE (PF) 100 MCG/2ML IJ SOLN
INTRAMUSCULAR | Status: AC
  Filled 2018-05-09: qty 2

## 2018-05-09 MED ORDER — APIXABAN 5 MG PO TABS
5.0000 mg | ORAL_TABLET | Freq: Two times a day (BID) | ORAL | Status: DC
  Administered 2018-05-10 – 2018-05-14 (×9): 5 mg via ORAL
  Filled 2018-05-09 (×11): qty 1

## 2018-05-09 NOTE — Sedation Documentation (Signed)
Patient is resting comfortably. 

## 2018-05-09 NOTE — Procedures (Signed)
CT guided core biopsy of right upper lobe mass.  3 cores obtained.  Minimal blood loss and no immediate complication.  CXR in 1 hour.

## 2018-05-09 NOTE — Progress Notes (Signed)
Subjective: pt somnolent after procedure, spent some time discussing clinical scenario with his son.  Objective:  Vital signs in last 24 hours: Vitals:   05/09/18 0952 05/09/18 0955 05/09/18 1054 05/09/18 1110  BP: (!) 146/87 (!) 146/96 (!) 161/99   Pulse: 75 85 95   Resp: 18 18 18    Temp:   98.4 F (36.9 C) 98.5 F (36.9 C)  TempSrc:   Axillary Axillary  SpO2: 100% 99% 94%   Weight:      Height:       Physical Exam  Constitutional: He appears well-developed and well-nourished.  Eyes: Right eye exhibits no discharge. Left eye exhibits no discharge. No scleral icterus.  Cardiovascular: Normal rate, regular rhythm, normal heart sounds and intact distal pulses. Exam reveals no gallop and no friction rub.  No murmur heard. Pulmonary/Chest: Effort normal. No respiratory distress. He has no wheezes. He has no rales.  Bandage over biopsy site, CTAB  Abdominal: Soft. Bowel sounds are normal. He exhibits no distension and no mass. There is no tenderness. There is no guarding.  Musculoskeletal: He exhibits no edema (no LE edema).  Neurological: He is alert.  Horizontal nystagmus seen, pt laying on left side    Assessment/Plan:  Principal Problem:   Acute vestibular syndrome Active Problems:   Nausea and vomiting   Atrial fibrillation (HCC)   Vertigo   Mass of upper lobe of right lung   Panlobular emphysema (HCC)   Nystagmus  Reginald [name] is a 77 yo with PMH of AAA (s/p stent in 2016), COPD, Asthma, DVT (2005), and BPH who presented with vertigo, nausea, and vomiting. He was admitted to the internal medicine teaching service for management. The specific problems addressed during admission are as follows:  Vertigo: BPPV vs progressive cerebellar dysfunction vs paraneoplastic syndrome dizziness and nausea/vomiting significantly improved after vestibular PT on HD#1 but have since returned and remains severe. Will continue vestibular PT cannot see pt again until 6/3.  Neuroradiologist concerned this is a permanent process due to progressive vascular disease.  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  Continues to be afebrile with treatment, leukocytosis and cough resolved.  -continue unasyn for 7 day course ends 6/8   New onset Atrial Fibrillation: Patient developed new onset A-Fib  while working with vestibular PT. Prior to A-Fib patient's HR  in the 50s. Patient currently remains in A-Fib with rates stable in the 80s-90s at rest. TSH and blood work within normal limits. Pt started on eliquis. ECHO normal EF, looks good for pts age  -still in NSR -will restart eliquis tomorrow and d/c scd's -diltiazem 24hr 120mg  daily -PRN metoprolol for HR >140   COPD/Asthma: Does not seem to be in exacerbation based on exam, O2 requirement is almost certainly due to pneumonia.  On rescue inhaler and flovent at home  -continue to monitor -duonebs PRN Q8  BPH: Chronic, stable. Continue home doxazosin and finasteride daily  Code Status: Full  Dispo: Anticipated discharge in approximately 3-5 day(s).  [name], MD 05/09/2018, 2:48 PM [name] MD PGY-1 Internal Medicine Pager # [phone]

## 2018-05-09 NOTE — Progress Notes (Signed)
Medicine attending: I examined this patient today together with resident physician Dr. Guadlupe Spanish and I concur with his evaluation and management plan which we discussed together. The patient tolerated the transcutaneous biopsy of the right lung mass without incident.  Good air movement over the right lung and resonant to percussion.  No suspicion for pneumothorax. We do not anticipate that results will be available before late Monday.  We have decided to go ahead with a trial of intravenous immunoglobulin 1 g/kg IV over 4 to 6 hours daily x2 doses and attempt to reverse what is likely an autoimmune/paraneoplastic manifestation of his lung cancer. Summary: 77 year old man former heavy smoker admitted on May 28 for further evaluation of nausea, vomiting, vertigo, ataxia, and unexplained lateral nystagmus.  Initial response to maneuvers that work with benign positional vertigo but only temporary improvement.  No focal lesion seen in the brain on CT or MRI.  Nothing to suggest underlying infection.  Chest radiograph showed a right lung mass which was confirmed on CT scan.  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  Transcutaneous biopsy done earlier today and results are outstanding. Additional issues addressed during this admission include new onset atrial fibrillation, rate currently controlled on medication. Very supportive family.  1 of his sons is present on a daily basis.

## 2018-05-09 NOTE — Progress Notes (Signed)
Physical Therapy Treatment Patient Details Name: Reginald [name] MRN: [id number] DOB: [birth year] Today's Date: 05/09/2018    History of Present Illness Reginald [name] is a 4 to M with Hx of AAA (s/p stent in 2016), COPD, Asthma, DVT (2005), and R heel fx who presented with nausea and vomiting for several days PTA. Vertigo is thought to be BPPV vs progressive cerebellar dysfunction vs paraneoplatic syndrome, newly dx lung mass due for bx on 05/09/18, L LL PNA, and new onset A-fib.  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      PT Comments    Pt lethargic after bx earlier this AM, although, interestingly on sedative his nystagmus is not as significant in intensity level.  Pt reports he can see me a bit more clearly.   Pt did not feel up to attempting mobility today (reported 4/10 nausea) and when pressed (we did not do anything yesterday and he will not have therapy over the weekend) he got frustrated/angry towards this therapist, apologizing later in the session.  Reginald [name], his son, was present and interested in any bed level exercises that he and his brother could preform with their dad over the weekend, so PROM/AAROM exercise program handout given and reviewed with Reginald [name].  PT will continue to follow acutely.   Follow Up Recommendations  SNF     Equipment Recommendations  Hospital bed;Wheelchair (measurements PT);Wheelchair cushion (measurements PT);Other (comment)(hoyer lift)    Recommendations for Other Services   NA     Precautions / Restrictions Precautions Precautions: Fall Precaution Comments: nausea/movement sensitive    Mobility  Bed Mobility Overal bed mobility: Needs Assistance Bed Mobility: Rolling Rolling: Min assist         General bed mobility comments: Min assist provided by son to get him repositioned frequently in the bed.             Cognition Arousal/Alertness: Lethargic Behavior During Therapy:  Flat affect Overall Cognitive Status: Within Functional Limits for tasks assessed                                 General Comments: Easily aggitated today with PT pressing him to try to move before the weekend break.  Pt apologized later.       Exercises General Exercises - Upper Extremity Shoulder Flexion: PROM;Both;10 reps Elbow Flexion: PROM;Both;10 reps Wrist Flexion: PROM;Both;10 reps General Exercises - Lower Extremity Ankle Circles/Pumps: PROM;Both;10 reps Heel Slides: PROM;Both;10 reps Hip ABduction/ADduction: PROM;Both;10 reps Other Exercises Other Exercises: Above listed exercises not actually preformed on pt as he did not feel up to doing them this PM (reported 4/10 nausea).  [name] son given HEP printout and explained frequency and repeitions.      General Comments General comments (skin integrity, edema, etc.): Pt not agreeable to much today, however, son was interested in [facility] exercises to do with him over the weekend and pt printed and reivewed PROM/AAROM exercise program detailed below.        Pertinent Vitals/Pain Pain Assessment: No/denies pain           PT Goals (current goals can now be found in the care plan section) Acute Rehab PT Goals Patient Stated Goal: to go home, and to have some of the neurologic symptoms ease. Progress towards PT goals: Progressing toward goals    Frequency    Min 4X/week  PT Plan Current plan remains appropriate       AM-PAC PT "6 Clicks" Daily Activity  Outcome Measure  Difficulty turning over in bed (including adjusting bedclothes, sheets and blankets)?: A Little Difficulty moving from lying on back to sitting on the side of the bed? : A Little Difficulty sitting down on and standing up from a chair with arms (e.g., wheelchair, bedside commode, etc,.)?: Unable Help needed moving to and from a bed to chair (including a wheelchair)?: Total Help needed walking in hospital room?: Total Help  needed climbing 3-5 steps with a railing? : Total 6 Click Score: 10    End of Session Equipment Utilized During Treatment: Oxygen Activity Tolerance: Patient limited by fatigue;Other (comment)(limited by nausea) Patient left: in bed;with call bell/phone within reach;with family/visitor present Nurse Communication: Mobility status PT Visit Diagnosis: Unsteadiness on feet (R26.81);Muscle weakness (generalized) (M62.81);Dizziness and giddiness (R42)     Time: 2763-9432 PT Time Calculation (min) (ACUTE ONLY): 27 min  Charges:  $Therapeutic Exercise: 8-22 mins $Self Care/Home Management: 8-22          Reginald [name] B. [place], [place], DPT [phone]            05/09/2018, 2:08 PM

## 2018-05-10 DIAGNOSIS — R12 Heartburn: Secondary | ICD-10-CM

## 2018-05-10 DIAGNOSIS — R911 Solitary pulmonary nodule: Secondary | ICD-10-CM

## 2018-05-10 DIAGNOSIS — J431 Panlobular emphysema: Secondary | ICD-10-CM

## 2018-05-10 LAB — CBC
HEMATOCRIT: 42.4 % (ref 39.0–52.0)
Hemoglobin: 15 g/dL (ref 13.0–17.0)
MCH: 34.6 pg — AB (ref 26.0–34.0)
MCHC: 35.4 g/dL (ref 30.0–36.0)
MCV: 97.9 fL (ref 78.0–100.0)
Platelets: 197 10*3/uL (ref 150–400)
RBC: 4.33 MIL/uL (ref 4.22–5.81)
RDW: 12.4 % (ref 11.5–15.5)
WBC: 7.3 10*3/uL (ref 4.0–10.5)

## 2018-05-10 LAB — BASIC METABOLIC PANEL
ANION GAP: 8 (ref 5–15)
BUN: 20 mg/dL (ref 6–20)
CALCIUM: 9.1 mg/dL (ref 8.9–10.3)
CO2: 29 mmol/L (ref 22–32)
Chloride: 99 mmol/L — ABNORMAL LOW (ref 101–111)
Creatinine, Ser: 1 mg/dL (ref 0.61–1.24)
GFR calc Af Amer: >60 mL/min (ref >60)
GFR calc non Af Amer: >60 mL/min (ref >60)
GLUCOSE: 131 mg/dL — AB (ref 65–99)
Potassium: 3.8 mmol/L (ref 3.5–5.1)
Sodium: 136 mmol/L (ref 135–145)

## 2018-05-10 MED ORDER — ALUM & MAG HYDROXIDE-SIMETH 200-200-20 MG/5ML PO SUSP
15.0000 mL | Freq: Four times a day (QID) | ORAL | Status: DC | PRN
  Administered 2018-05-10: 15 mL via ORAL
  Filled 2018-05-10: qty 30

## 2018-05-10 MED ORDER — PANTOPRAZOLE SODIUM 40 MG PO TBEC
40.0000 mg | DELAYED_RELEASE_TABLET | Freq: Every day | ORAL | Status: DC
  Administered 2018-05-10: 40 mg via ORAL
  Filled 2018-05-10: qty 1

## 2018-05-10 MED ORDER — FAMOTIDINE 20 MG PO TABS
10.0000 mg | ORAL_TABLET | Freq: Every day | ORAL | Status: DC | PRN
  Administered 2018-05-10: 10 mg via ORAL
  Filled 2018-05-10: qty 1

## 2018-05-10 NOTE — Progress Notes (Signed)
Meds given for heartburn.  Awaiting Protonix. Son states he has been giving tums all day without help.   Reminded son that any meds should be given by nursing staff due to side effects and drug interactions.  Son rolled his eyes. Educated son that Tums has calcium and levels may need to be adjusted with over use.

## 2018-05-10 NOTE — Progress Notes (Signed)
   Subjective: Reginald [name] reports his dizziness is not much better following IVIG yesterday. He did have some nausea and vomited once overnight. He also notes that he has been having some heartburn. He denies shortness of breath or chest pain.   Objective:  Vital signs in last 24 hours: Vitals:   05/09/18 2014 05/09/18 2126 05/10/18 0453 05/10/18 0834  BP: (!) 145/69 (!) 146/81 (!) 147/76 110/60  Pulse:  76 70 80  Resp:  16 16   Temp:  98.8 F (37.1 C) 97.6 F (36.4 C) 98.8 F (37.1 C)  TempSrc:  Oral Oral Oral  SpO2:  94% (!) 89% 96%  Weight:      Height:       General: chronically ill appearing, no acute distress  Cardiac: regular rate and rhythm, no murmur appreciated, no peripheral edema or calf tenderness   Pulm: lungs clear to auscultation, breath sounds hearth throughout all lung fields  GI: abdomen is soft, non tender, non distended  Neuro: there is a horizontal bidirectional nystagmus    Assessment/Plan:  Coen Miyasato is a 77 yo with PMH of AAA (s/p stent in 2016), COPD,Asthma, DVT (2005), and BPH who presented with vertigo, nausea, and vomiting. His symptoms were not responsive to vestibular physical therapy. Xray of the chest incidentally found a pulmonary nodule and the consideration of a paraneoplastic syndrome is being considered.       Vertigo dizziness and nausea/vomiting significantly improved after vestibular PT on HD#1 but have since returned and remains severe. Will continue vestibular PT cannot see pt again until 6/3. Neuroradiologist concerned this is a permanent process due to progressive vascular disease, known chronic cerebellar strokes.  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  -mass is peripheral in the RUL biopsy performed 6/7 without signs of complication  -recommended will need PET CT outpt along with oncology referral -Enlarged Right hilar node present     Panlobular emphysema (HCC) COPD/Asthma:  On rescue inhaler and flovent at home - wean to room air  -continue budesonide nebs  -duonebs PRN Q8    Atrial fibrillation (HCC) - Resolved - continue diltiazem 24 hr 120 mg daily   BPH -Continuehome doxazosin and finasteride daily  Dispo: Anticipated discharge in approximately 3 day(s).   Ledell Noss, MD 05/10/2018, 11:57 AM Pager: [phone]

## 2018-05-10 NOTE — Progress Notes (Signed)
Subjective: Patient in bed awake alert and oriented x3 in no acute distress.  Patient's mentation intact.  Continues to have bilateral  pendulur nystagmus, from a far patient has diplopia where at the door he saw her for versions of me, closer to him he saw to versions, and right in front of him he could only see one singular version of me.  He denies ocular pain but admits to increased dizziness, feeling like he spinning with extraocular movements. He denies headache nausea or vomiting at this time.  Exam: Vitals:   05/10/18 0453 05/10/18 0834  BP: (!) 147/76 110/60  Pulse: 70 80  Resp: 16   Temp: 97.6 F (36.4 C) 98.8 F (37.1 C)  SpO2: (!) 89% 96%    Physical Exam  HEENT-  Normocephalic, no lesions, without obvious abnormality.  Normal external eye and conjunctiva.   Cardiovascular- S1-S2 audible, pulses palpable throughout   Lungs-no rhonchi or wheezing noted, no excessive working breathing.  Saturations within normal limits Abdomen- All 4 quadrants palpated and nontender Musculoskeletal-no joint tenderness, deformity or swelling Skin-warm and dry, no hyperpigmentation, vitiligo, or suspicious lesions  Neuro:  Mental Status: Alert, oriented, thought content appropriate.  Speech fluent without evidence of aphasia.  Able to follow 3 step commands without difficulty. Cranial Nerves: II:  Visual fields grossly normal,  III,IV, VI: ptosis not present, extra-ocular motions intact bilaterally pupils equal, round, reactive to light and accommodation.  Prominent horizontal pendular nystagmus with central gaze with less prominence in  right and left sided extraocular movements. V,VII: smile symmetric, facial light touch sensation normal bilaterally VIII: hearing intact to voice IX,X: uvula rises symmetrically XI: bilateral shoulder shrug XII: midline tongue extension Motor: Essential tremors in upper extremities with movement Right : Upper extremity   5/5    Left:     Upper extremity    5/5  Lower extremity   5/5     Lower extremity   5/5 Tone and bulk:normal tone throughout; no atrophy noted Sensory: Pinprick and light touch intact throughout, bilaterally Deep Tendon Reflexes: 2+ and symmetric throughout Plantars: Right: downgoing   Left: downgoing Cerebellar: normal heel-to-shin test bilaterally Gait: Not assessed    Medications:  . apixaban  5 mg Oral BID  . budesonide (PULMICORT) nebulizer solution  0.25 mg Nebulization Daily  . diltiazem  120 mg Oral Daily  . doxazosin  4 mg Oral QHS  . feeding supplement (ENSURE ENLIVE)  237 mL Oral BID BM  . finasteride  5 mg Oral QHS  . Immune Globulin 10%  1 g/kg Intravenous Q24 Hr x 2  . mouth rinse  15 mL Mouth Rinse BID  . nystatin  5 mL Oral QID    Pertinent Labs/Diagnostics:    Assessment:  Reginald [name] is an 77 y.o. male with PMH Hyperlipidemia, DVT, AAA s/p stent 2016, heavy tobacco use, new onset afib, who presented to [facility] ED on 5/28 with a 3 day history of nausea/vomiting and vertigo. Patient initially thought to have severe BPPV dix-hall-pike and epley maneuvers performed with short term relief, meclizine also did not provide much relief, symptoms completely returned. MRI brain and head CT negative for acute stroke. Chest CT found RUL mass concerning for malignancy. Biopsy results pending Based upon the exam findings, the prominent nystagmus appears less likely to be secondary to BPPV, and more concerning for a paraneoplastic etiology. Recommend IVIG x 5 days as empiric treatment for the prominent pendular nystagmus. No improvement after 1 dose of IVIG  Recommendations: 1)  Continue to empirically treat with IVIG 0.4 g/kg x 2 days and monitor for improvement of ocular symptoms and signs, reassess then continue for total of 5 doses.   First dose of IVIG  started on 05/09/2018 2) anti-Yo antibody results pending.  3) Neurology will continue to follow     Letha Cape [facility] Neuro-hospitalist  Team [phone] 05/10/2018, 12:32 PM

## 2018-05-10 NOTE — Progress Notes (Signed)
  Date: 05/10/2018  Patient name: Reginald [name]  Medical record number: [id number]  Date of birth: [birth year]   I have seen and evaluated this patient and I have discussed the plan of care with the house staff. Please see Dr. Fredrik Cove note for complete details. I concur with her findings.  Sid Falcon, MD 05/10/2018, 2:46 PM

## 2018-05-10 NOTE — Progress Notes (Signed)
Patient ID: Reginald [name], male   DOB: 06/14/1941, 77 y.o.   MRN: [id number]  MDs paged three times about misc. labs that were ordered by Dr. Lenice Pressman but are not clear as to what labs were wanted. Son of the patient states that the Oncologist originally wanted these test but no one is clear as to what to draw. Otila Kluver RN and myself have consulted with lab several times today and have had no solution to this issue. Blood was drawn this morning and is in the lab. [facility] needs an MD to call them to confirm what is need. [facility] # and pt # were given to MD to follow up but have not heard back from them.

## 2018-05-11 LAB — BASIC METABOLIC PANEL
ANION GAP: 6 (ref 5–15)
BUN: 27 mg/dL — AB (ref 6–20)
CALCIUM: 8.9 mg/dL (ref 8.9–10.3)
CO2: 31 mmol/L (ref 22–32)
Chloride: 100 mmol/L — ABNORMAL LOW (ref 101–111)
Creatinine, Ser: 1.03 mg/dL (ref 0.61–1.24)
GFR calc Af Amer: >60 mL/min (ref >60)
Glucose, Bld: 103 mg/dL — ABNORMAL HIGH (ref 65–99)
POTASSIUM: 3.8 mmol/L (ref 3.5–5.1)
SODIUM: 137 mmol/L (ref 135–145)

## 2018-05-11 MED ORDER — FAMOTIDINE 20 MG PO TABS
20.0000 mg | ORAL_TABLET | Freq: Two times a day (BID) | ORAL | Status: DC | PRN
  Filled 2018-05-11: qty 1

## 2018-05-11 MED ORDER — POLYETHYLENE GLYCOL 3350 17 G PO PACK
17.0000 g | PACK | Freq: Every day | ORAL | Status: DC
  Administered 2018-05-11 – 2018-05-14 (×4): 17 g via ORAL
  Filled 2018-05-11 (×5): qty 1

## 2018-05-11 MED ORDER — LACTULOSE 10 GM/15ML PO SOLN
30.0000 g | Freq: Once | ORAL | Status: AC
  Administered 2018-05-11: 30 g via ORAL
  Filled 2018-05-11: qty 45

## 2018-05-11 NOTE — Progress Notes (Signed)
   Subjective: Reginald [name] reports dizziness is bad today, was better yesterday.  Has not had a bowel movement yet but passing gas.    Objective:  Vital signs in last 24 hours: Vitals:   05/11/18 0559 05/11/18 0743 05/11/18 0744 05/11/18 0845  BP: (!) 160/85 (!) 156/73    Pulse: (!) 142   88  Resp: 18 18  18   Temp: 98.4 F (36.9 C) 98.9 F (37.2 C)    TempSrc: Oral Oral    SpO2: 93% 95% 93% 93%  Weight:      Height:      Physical Exam  Constitutional: He appears well-developed and well-nourished.  Eyes: Right eye exhibits no discharge. Left eye exhibits no discharge. No scleral icterus.  Cardiovascular: Normal rate, regular rhythm and normal heart sounds. Exam reveals no gallop and no friction rub.  No murmur heard. Pulmonary/Chest: Effort normal. No respiratory distress. He has no wheezes. He has rales (RLL mild ).  Abdominal: Soft. Bowel sounds are normal. He exhibits no distension and no mass. There is tenderness (LLQ). There is no guarding.  Neurological: He is alert.  Horizontal nystagmus appreciated     Assessment/Plan:  Reginald [name] is a 77 yo with PMH of AAA (s/p stent in 2016), COPD,Asthma, DVT (2005), and BPH who presented with vertigo, nausea, and vomiting. His symptoms did not respond as expected to vestibular physical therapy. Xray of the chest incidentally found a pulmonary nodule with confirmed lung mass peripheral RUL and paraneoplastic syndrome is being considered.   Vertigo: cause is still uncertain, dizziness and nausea/vomiting significantly improved after vestibular PT on HD#1 but have since returned and remains severe. Will continue vestibular PT. Neuroradiologist concerned this is a permanent process due to progressive vascular disease, known chronic cerebellar strokes. Paraneoplastic syndrome also possible.    -Vestibular PT, pt using tilt bed -Meclizine 25 mg TID PRN -zofran PRN for nausea -IR biopsy results pending -IVIG given on 6/8 will  continue -paraneoplastic autoantibody testing in process  -PT recs: SNF   GERD/dyspepsia  -pepcid 20mg  BID PRN  Mass of upper lobe of right lung: Likely malignant tumor found in periphery of RUL during pneumonia workup.   -mass is peripheral in the RUL biopsy performed 6/7 without signs of complication  -recommended will need PET CT outpt along with oncology referral -Enlarged Right hilar node present   COPD w/ Panlobular emphysema: On rescue inhaler and flovent at home  -wean to room air  -continue budesonide nebs  -duonebs PRN Q8  PAF: diagnosed this admission  - continue diltiazem 24 hr 120 mg daily, eliquis 5mg  BID  BPH  -Continuehome doxazosin and finasteride daily   Dispo: Anticipated discharge in approximately 2-3 day(s).   [name], MD 05/11/2018, 8:52 AM [name] MD PGY-1 Internal Medicine Pager # [phone]

## 2018-05-11 NOTE — Plan of Care (Signed)
  Problem: Education: Goal: Knowledge of General Education information will improve Outcome: Progressing Note:  POC reviewed with pt.   

## 2018-05-11 NOTE — Progress Notes (Signed)
  Date: 05/11/2018  Patient name: Reginald [name]  Medical record number: [id number]  Date of birth: 10/07/1941   This patient's plan of care was discussed with the house staff. Please see their note for complete details. I concur with their findings.   Sid Falcon, MD 05/11/2018, 8:07 PM

## 2018-05-12 ENCOUNTER — Inpatient Hospital Stay (HOSPITAL_COMMUNITY): Payer: Medicare Other

## 2018-05-12 DIAGNOSIS — H5500 Unspecified nystagmus: Secondary | ICD-10-CM

## 2018-05-12 LAB — CBC
HEMATOCRIT: 41.2 % (ref 39.0–52.0)
HEMOGLOBIN: 14.8 g/dL (ref 13.0–17.0)
MCH: 34.7 pg — AB (ref 26.0–34.0)
MCHC: 35.9 g/dL (ref 30.0–36.0)
MCV: 96.7 fL (ref 78.0–100.0)
Platelets: 212 10*3/uL (ref 150–400)
RBC: 4.26 MIL/uL (ref 4.22–5.81)
RDW: 13 % (ref 11.5–15.5)
WBC: 7.6 10*3/uL (ref 4.0–10.5)

## 2018-05-12 LAB — RENAL FUNCTION PANEL
Albumin: 2.5 g/dL — ABNORMAL LOW (ref 3.5–5.0)
Anion gap: 8 (ref 5–15)
BUN: 28 mg/dL — AB (ref 6–20)
CO2: 29 mmol/L (ref 22–32)
Calcium: 8.8 mg/dL — ABNORMAL LOW (ref 8.9–10.3)
Chloride: 101 mmol/L (ref 101–111)
Creatinine, Ser: 1.12 mg/dL (ref 0.61–1.24)
GFR calc Af Amer: >60 mL/min (ref >60)
GFR calc non Af Amer: >60 mL/min (ref >60)
GLUCOSE: 122 mg/dL — AB (ref 65–99)
POTASSIUM: 3.7 mmol/L (ref 3.5–5.1)
Phosphorus: 2.8 mg/dL (ref 2.5–4.6)
Sodium: 138 mmol/L (ref 135–145)

## 2018-05-12 MED ORDER — PANTOPRAZOLE SODIUM 40 MG PO TBEC
40.0000 mg | DELAYED_RELEASE_TABLET | Freq: Every day | ORAL | Status: DC
  Administered 2018-05-12 – 2018-05-14 (×3): 40 mg via ORAL
  Filled 2018-05-12 (×4): qty 1

## 2018-05-12 MED ORDER — IMMUNE GLOBULIN (HUMAN) 20 GM/200ML IV SOLN: 400.0000 mg/kg | INTRAVENOUS | Status: DC

## 2018-05-12 MED ORDER — GADOBENATE DIMEGLUMINE 529 MG/ML IV SOLN
20.0000 mL | Freq: Once | INTRAVENOUS | Status: AC
  Administered 2018-05-12: 20 mL via INTRAVENOUS

## 2018-05-12 MED ORDER — FLEET ENEMA 7-19 GM/118ML RE ENEM
1.0000 | ENEMA | Freq: Once | RECTAL | Status: AC
  Administered 2018-05-12: 1 via RECTAL
  Filled 2018-05-12: qty 1

## 2018-05-12 MED ORDER — KCL-LACTATED RINGERS 20 MEQ/L IV SOLN
INTRAVENOUS | Status: DC
  Filled 2018-05-12: qty 1000

## 2018-05-12 MED ORDER — LACTATED RINGERS IV SOLN
INTRAVENOUS | Status: DC
  Administered 2018-05-12 – 2018-05-13 (×2): via INTRAVENOUS
  Filled 2018-05-12 (×4): qty 1000

## 2018-05-12 NOTE — Progress Notes (Signed)
Medicine attending: I examined this patient today together with resident physician Dr. Guadlupe Spanish and I concur with his evaluation and management plan which will be detailed in his progress note to follow. We appreciate follow-up from neurology.  This is a essentially a second opinion from a different neurologist who is concerned that we may be dealing with a small hemorrhage or infarct in the brainstem to account for his nystagmus. Nystagmus seems less today but patient is poorly interactive with exam.  Hard to assess any real benefit so far from intravenous immunoglobulin given on June 7 and June 8.  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  Special stains being done to subtype. Patient is older son at the bedside.  Everybody waiting anxiously for final results of the pathology.  The patient's performance status may be the limiting factor in what can or cannot be done for his malignancy.  Hopefully molecular analysis will reveal a EGFR or ALK mutation that would be amenable to a less toxic oral therapy.  He will need a PET scan to confirm stage IV disease.  At present he is a clinical stage II if we discounted his neurologic findings as being paraneoplastic.

## 2018-05-12 NOTE — Plan of Care (Signed)
  Problem: Education: Goal: Knowledge of General Education information will improve Outcome: Progressing Note:  POC reviewed with pt.; Zofran given 30-40 min. prior to hs meds and pt. tolerated better.

## 2018-05-12 NOTE — Progress Notes (Signed)
CSW continuing to follow for discharge planning needs.  Percell Locus Giordana Weinheimer LCSW [phone]

## 2018-05-12 NOTE — Progress Notes (Addendum)
   Subjective: Mr. Wence reports continued vertigo, seems to be somewhat less nystagmus today.  He had some mild success with tapwater enema but still constipated, not in pain or discomfort but still constipated.  Objective:  Vital signs in last 24 hours: Vitals:   05/12/18 0810 05/12/18 0857 05/12/18 1156 05/12/18 1450  BP: (!) 152/86  (!) 145/86 140/69  Pulse: 87 85  85  Resp: 16 18  18   Temp: 98.7 F (37.1 C)   97.6 F (36.4 C)  TempSrc: Oral   Oral  SpO2: 94% 95%  92%  Weight:      Height:      Physical Exam  Constitutional: He appears well-developed and well-nourished.  Eyes: Right eye exhibits no discharge. Left eye exhibits no discharge. No scleral icterus.  Cardiovascular: Normal rate, regular rhythm and normal heart sounds. Exam reveals no gallop and no friction rub.  No murmur heard. Pulmonary/Chest: Effort normal. No respiratory distress. He has decreased breath sounds in the left lower field. He has no wheezes.  Abdominal: Soft. Bowel sounds are normal. He exhibits no distension and no mass. There is no guarding.  Neurological: He is alert.  Horizontal nystagmus appreciated     Assessment/Plan:   Vertigo: cause is still uncertain, dizziness and nausea/vomiting significantly improved after vestibular PT on HD#1 but have since returned and remains severe. Will continue vestibular PT. 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 Paraneoplastic syndrome also possible. 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   -will need PET CT outpt along with oncology referral   COPD w/ Panlobular emphysema: On rescue inhaler and flovent at home  -continue budesonide nebs  -duonebs PRN Q8  PAF: diagnosed this admission  - continue diltiazem 24 hr 120 mg daily, eliquis 5mg  BID  BPH  -Continuehome doxazosin and finasteride daily  Constipation: pt immobilized for long period, has had poor oral intake.  -increasing bowel regimen, enema today  Dispo: Anticipated discharge in approximately 1-2 day(s).   [name], MD 05/12/2018, 6:28 PM [name] MD PGY-1 Internal Medicine Pager # [phone]

## 2018-05-12 NOTE — Progress Notes (Signed)
Notified [name] that EKG showed NSR and that a request for DNR and Power of attorney was on pt's chart, but order not in computer.  [name] said he would notify pt's doctor.

## 2018-05-12 NOTE — Progress Notes (Addendum)
Subjective: Patient in bed with eyes closed due to sever vertigo.   Exam:     Vitals:   05/12/18 0810 05/12/18 0857  BP: (!) 152/86   Pulse: 87 85  Resp: 16 18  Temp: 98.7 F (37.1 C)   SpO2: 94% 95%    Physical Exam  HEENT-  Normocephalic, no lesions, without obvious abnormality.  Normal external eye and conjunctiva.   Extremities- Warm, dry and intact Musculoskeletal-no joint tenderness, deformity or swelling Skin-warm and dry, no hyperpigmentation, vitiligo, or suspicious lesions Neuro: Mental Status: Alert, oriented, thought content appropriate.  Speech fluent without evidence of aphasia.  Able to follow 3 step commands without difficulty. Cranial Nerves: II: Visual fields grossly normal,  III,IV, VI: Visual fields grossly normal. PERRL. Ptosis not present, extra-ocular motionsare full bilaterally. There is prominent horizontal pendular nystagmus with central gaze and worsens with upward gaze. pupils equal, round, reactive to light and accommodation V,VII: smile symmetric, facial light touch sensation normal bilaterally VIII: hearing normal bilaterally IX,X: uvula rises symmetrically XI: bilateral shoulder shrug XII: midline tongue extension Motor: Right :  Upper extremity   5/5                                      Left:     Upper extremity   5/5             Lower extremity   5/5                                                  Lower extremity   5/5 Tone and bulk:normal tone throughout; no atrophy noted Sensory: Pinprick and light touch intact throughout, bilaterally Deep Tendon Reflexes: 2+ and symmetric throughout Plantars: Right: downgoing                                Left: downgoing Cerebellar: Ataxia with Finger to nose.  Gait: not tested    Medications:  Scheduled: . apixaban  5 mg Oral BID  . budesonide (PULMICORT) nebulizer solution  0.25 mg Nebulization Daily  . diltiazem  120 mg Oral Daily  . doxazosin  4 mg Oral QHS  . feeding supplement  (ENSURE ENLIVE)  237 mL Oral BID BM  . finasteride  5 mg Oral QHS  . mouth rinse  15 mL Mouth Rinse BID  . nystatin  5 mL Oral QID  . pantoprazole  40 mg Oral Daily  . polyethylene glycol  17 g Oral Daily  . sodium phosphate  1 enema Rectal Once   Pertinent Labs/Diagnostics: --lung Bx pending  Ct head 04/29/18: No acute finding by CT. Advanced chronic appearing small vessel ischemic changes of the cerebral hemispheric white matter, somewhat progressive since 2013. 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  MRI BRAIN w/o contrast 04/29/18: 1. No acute intracranial process. 2. 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 3. Severe chronic small vessel ischemic changes. Old small cerebellar infarcts.  CT chest w/ contrast: 1. There is a mass in the periphery of the right upper lobe posteriorly measuring 5.2 x 2.7 cm abutting the pleura without identified chest wall invasion. This mass is concerning for malignancy. There is 1 abnormal  node in the mediastinum as well as a borderline node in the right hilum. I suspect at least 1 of these nodes is likely metastatic. Recommend a PET-CT for evaluation of the lung mass and lymph nodes. The mass will likely ultimately require tissue sampling for diagnosis. 2. Probable pneumonia in the left lung base. There is also a component of atelectasis. Recommend follow-up to resolution. 3. Atherosclerotic changes in the thoracic aorta. 4. Emphysematous changes in the lungs.    Etta Quill PA-C [facility] [phone]  Attending addendum Patient seen and examined. Agree with history and physical above. I have independently reviewed the images.  I also reviewed the MRI of the brain with the radiologist, who does not see any acute findings but the 1.5 Tesla brain MRI images without contrast are not sufficient to properly evaluate the brainstem for small lesions.  Assessment: 77 YO  male presenting with nausea and vomiting associated with vertigo.  This was initially attributed to a peripheral process, but a neurological consultation was obtained and there was no relief from medical management of the peripheral vertigo.  He continues to have this very bizarre and chaotic looking vertigo in all directions. There is a new lung mass that was diagnosed, biopsy results of which are pending at this time.Paraneoplastic labs are also pending at this time. Other than considering this to be a paraneoplastic process, Other considerations include small bleed/infarct in the brainstem that can cause this degree of nystagmus as well as metastatic process given the cancer.  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 --Less likely other causes such as ocular palatal tremor  Recommendations: --completed 2g of IVIG (received higher dose 1g x2 days) - We will recommend obtaining an MRI of the brain with and without contrast on the 3 Tesla scanner (prior MRI was on the 1.5 Tesla scanner and was without contrast) to look for any changes or evidence of small bleed/infarct or enhancement and will closely look at brainstem for any evidence of lesions in the triangle of Guillain Mollaret (red nucleus, dentate nucleus and inferior olivary nucleus) --Pending biopsy results -Pending paraneoplastic antibody results I have paged the primary team and will discuss this in detail with them.     05/12/2018, 10:37 AM  -- Amie Portland, MD [facility] Pager: [phone] If 7pm to 7am, please call on call as listed on AMION.

## 2018-05-12 NOTE — Progress Notes (Deleted)
Subjective: Patient in bed with eyes closed due to sever vertigo.   Exam: Vitals:   05/12/18 0810 05/12/18 0857  BP: (!) 152/86   Pulse: 87 85  Resp: 16 18  Temp: 98.7 F (37.1 C)   SpO2: 94% 95%    Physical Exam   HEENT-  Normocephalic, no lesions, without obvious abnormality.  Normal external eye and conjunctiva.   Extremities- Warm, dry and intact Musculoskeletal-no joint tenderness, deformity or swelling Skin-warm and dry, no hyperpigmentation, vitiligo, or suspicious lesions    Neuro:  Mental Status: Alert, oriented, thought content appropriate.  Speech fluent without evidence of aphasia.  Able to follow 3 step commands without difficulty. Cranial Nerves: II: Visual fields grossly normal,  III,IV, VI: Visual fields grossly normal. PERRL. Ptosis not present, extra-ocular motions are full bilaterally. There is prominent horizontal pendular nystagmus with central gaze and worsens with upward gaze. pupils equal, round, reactive to light and accommodation V,VII: smile symmetric, facial light touch sensation normal bilaterally VIII: hearing normal bilaterally IX,X: uvula rises symmetrically XI: bilateral shoulder shrug XII: midline tongue extension Motor: Right : Upper extremity   5/5    Left:     Upper extremity   5/5  Lower extremity   5/5     Lower extremity   5/5 Tone and bulk:normal tone throughout; no atrophy noted Sensory: Pinprick and light touch intact throughout, bilaterally Deep Tendon Reflexes: 2+ and symmetric throughout Plantars: Right: downgoing   Left: downgoing Cerebellar: Ataxia with Finger to nose.  Gait: not tested    Medications:  Scheduled: . apixaban  5 mg Oral BID  . budesonide (PULMICORT) nebulizer solution  0.25 mg Nebulization Daily  . diltiazem  120 mg Oral Daily  . doxazosin  4 mg Oral QHS  . feeding supplement (ENSURE ENLIVE)  237 mL Oral BID BM  . finasteride  5 mg Oral QHS  . mouth rinse  15 mL Mouth Rinse BID  . nystatin  5 mL  Oral QID  . pantoprazole  40 mg Oral Daily  . polyethylene glycol  17 g Oral Daily  . sodium phosphate  1 enema Rectal Once    Pertinent Labs/Diagnostics: --lung Bx pending  Ct head 04/29/18:  No acute finding by CT. Advanced chronic appearing small vessel ischemic changes of the cerebral hemispheric white matter, somewhat progressive since 2013. 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  MRI BRAIN w/o contrast 04/29/18: 1. No acute intracranial process. 2. 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 3. Severe chronic small vessel ischemic changes. Old small cerebellar infarcts.  CT chest w/ contrast: 1. There is a mass in the periphery of the right upper lobe posteriorly measuring 5.2 x 2.7 cm abutting the pleura without identified chest wall invasion. This mass is concerning for malignancy. There is 1 abnormal node in the mediastinum as well as a borderline node in the right hilum. I suspect at least 1 of these nodes is likely metastatic. Recommend a PET-CT for evaluation of the lung mass and lymph nodes. The mass will likely ultimately require tissue sampling for diagnosis. 2. Probable pneumonia in the left lung base. There is also a component of atelectasis. Recommend follow-up to resolution. 3. Atherosclerotic changes in the thoracic aorta. 4. Emphysematous changes in the lungs.      Etta Quill PA-C [facility] [phone]   Assessment: 77 YO male presenting with nausea and vomiting associated with vertigo. Initially thought to have BPPV dix-hall pike and Epley Maneuvers performed with  some improvement of symptoms that then completely returned. MRI brain and head CT negative for acute stroke. Lung mass noted while in hospital. IVIG recommended for full 5 days. Due to mass will also work up for paraneoplastic syndrome.     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

## 2018-05-13 DIAGNOSIS — R58 Hemorrhage, not elsewhere classified: Secondary | ICD-10-CM

## 2018-05-13 DIAGNOSIS — K210 Gastro-esophageal reflux disease with esophagitis: Secondary | ICD-10-CM

## 2018-05-13 DIAGNOSIS — R112 Nausea with vomiting, unspecified: Secondary | ICD-10-CM

## 2018-05-13 DIAGNOSIS — K5900 Constipation, unspecified: Secondary | ICD-10-CM

## 2018-05-13 DIAGNOSIS — C3411 Malignant neoplasm of upper lobe, right bronchus or lung: Principal | ICD-10-CM

## 2018-05-13 LAB — BASIC METABOLIC PANEL
ANION GAP: 4 — AB (ref 5–15)
BUN: 24 mg/dL — ABNORMAL HIGH (ref 6–20)
CHLORIDE: 104 mmol/L (ref 101–111)
CO2: 29 mmol/L (ref 22–32)
Calcium: 8.9 mg/dL (ref 8.9–10.3)
Creatinine, Ser: 1.31 mg/dL — ABNORMAL HIGH (ref 0.61–1.24)
GFR calc Af Amer: 59 mL/min — ABNORMAL LOW (ref >60)
GFR, EST NON AFRICAN AMERICAN: 51 mL/min — AB (ref >60)
GLUCOSE: 108 mg/dL — AB (ref 65–99)
POTASSIUM: 4.2 mmol/L (ref 3.5–5.1)
Sodium: 137 mmol/L (ref 135–145)

## 2018-05-13 MED ORDER — HYDRALAZINE HCL 10 MG PO TABS
10.0000 mg | ORAL_TABLET | Freq: Two times a day (BID) | ORAL | Status: DC
  Administered 2018-05-13 – 2018-05-14 (×2): 10 mg via ORAL
  Filled 2018-05-13 (×2): qty 1

## 2018-05-13 MED ORDER — HYDRALAZINE HCL 10 MG PO TABS
10.0000 mg | ORAL_TABLET | Freq: Three times a day (TID) | ORAL | Status: DC
  Administered 2018-05-13: 10 mg via ORAL
  Filled 2018-05-13 (×2): qty 1

## 2018-05-13 MED ORDER — LACTATED RINGERS IV SOLN
INTRAVENOUS | Status: DC
  Administered 2018-05-13: 100 mL/h via INTRAVENOUS

## 2018-05-13 MED ORDER — ONDANSETRON 4 MG PO TBDP
ORAL_TABLET | ORAL | Status: AC
  Administered 2018-05-13: 4 mg
  Filled 2018-05-13: qty 1

## 2018-05-13 MED ORDER — BISACODYL 10 MG RE SUPP: 10.0000 mg | Freq: Once | RECTAL | Status: AC

## 2018-05-13 MED ORDER — BISACODYL 5 MG PO TBEC
5.0000 mg | DELAYED_RELEASE_TABLET | Freq: Once | ORAL | Status: AC
  Administered 2018-05-13: 5 mg via ORAL
  Filled 2018-05-13: qty 1

## 2018-05-13 MED ORDER — SODIUM CHLORIDE 0.9 % IV SOLN
INTRAVENOUS | Status: DC
  Administered 2018-05-13: 75 mL/h via INTRAVENOUS

## 2018-05-13 NOTE — Progress Notes (Signed)
NEURO HOSPITALIST PROGRESS NOTE   Subjective: Patient awake in bed, eyes open on 2L [place], NAD. Son at bedside. Patient nystagmus has improved since I last saw him. Son also stated that the nystagmus has improved. Patient indifferent on whether or not it has gotten better. Denies any problems breathing.  Exam: Vitals:   05/12/18 2200 05/13/18 0528  BP: (!) 160/80 (!) 167/90  Pulse:  75  Resp:  17  Temp:  98.2 F (36.8 C)  SpO2:  93%  Physical Exam  HEENT-  Normocephalic, no lesions, without obvious abnormality.  Normal external eye and conjunctiva.   Cardiovascular- S1-S2 audible, pulses palpable throughout   Lungs-no rhonchi or wheezing noted, no excessive working breathing.  Saturations within normal limits on 2 L [place]. Abdomen- BS present a;; 4 quadrants. Extremities- Warm, dry and intact, SCD on bilaterally Musculoskeletal-no joint tenderness, deformity or swelling Skin-warm and dry,  Neuro:  Mental Status: Alert, oriented. States that he is very tired.   Speech fluent without evidence of aphasia.  Able to follow commands without difficulty. Cranial Nerves: II: Visual fields grossly normal,  III,IV, VI: ptosis not present, extra-ocular motions intact bilaterally. Though Nystagmus  Has improved. Still worse on upward gaze. Nystagmus not as prominent when resting. pupils equal, round. reactive to light and accommodation  V,VII: smile symmetric, facial light touch sensation normal bilaterally VIII: hearing normal bilaterally IX,X: uvula rises symmetrically XI: bilateral shoulder shrug XII: midline tongue extension, ?rhytmic motions that coincide with nystagmus that have decreased in intensity.  No palatal tremor. Motor: Right : Upper extremity   5/5    Left:     Upper extremity   5/5  Lower extremity   5/5     Lower extremity   5/5 Tone and bulk:normal tone throughout; no atrophy noted Sensory:  light touch intact throughout, bilaterally Deep Tendon Reflexes:  2+ and symmetric throughout Plantars: Right: downgoing   Left: downgoing Cerebellar: Mild ataxia with  finger-to-nose bilaterally,  normal heel-to-shin test Gait: not tested     Medications:  Scheduled: . apixaban  5 mg Oral BID  . bisacodyl  5 mg Oral Once   Or  . bisacodyl  10 mg Rectal Once  . budesonide (PULMICORT) nebulizer solution  0.25 mg Nebulization Daily  . diltiazem  120 mg Oral Daily  . doxazosin  4 mg Oral QHS  . feeding supplement (ENSURE ENLIVE)  237 mL Oral BID BM  . finasteride  5 mg Oral QHS  . hydrALAZINE  10 mg Oral Q8H  . mouth rinse  15 mL Mouth Rinse BID  . nystatin  5 mL Oral QID  . pantoprazole  40 mg Oral Daily  . polyethylene glycol  17 g Oral Daily   Continuous: . lactated ringers 100 mL/hr (05/13/18 0759)   RJJ:[id number] **OR** acetaminophen, alum & mag hydroxide-simeth, calcium carbonate, ipratropium-albuterol, meclizine, metoprolol tartrate, ondansetron (ZOFRAN) IV, senna-docusate  Pertinent Labs/Diagnostics:  MRI Brain w/ w/o contrast 05-12-18 1. Numerous bilateral, peripheral-predominant chronic microhemorrhages in a pattern consistent with cerebral amyloid angiopathy. This is much more clearly demonstrated on the current examination due to the increased sensitivity of susceptibility-weighted imaging compared to traditional gradient echo sequences. 2. 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 Certain conditions of neurodegeneration with brain iron accumulation (e.g. Woodhouse-Sakati syndrome) may also cause this appearance, but are  exceedingly rare. The appearance of the brainstem itself is normal. 3. Severe white matter changes secondary to chronic ischemic microangiopathy. Generalized atrophy. 4. 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  Laurey Morale, MSN, NP-C [facility] [phone]  Attending  neurologist's note to follow  Attending addendum I have seen and examined the patient I have independently reviewed images. The 3T MRI of the brain with and without contrast shows numerous chronic micro hemorrhages which were not as obvious on the prior gradient images.  This could be concerning for amyloid.  Also showing her chronic microhemorrhages in both dentate nuclei, that could reflect chronic microhemorrhage or neurodegenerative conditions with brain iron deposition.  This could contribute to the nystagmus.  Severe white matter changes secondary to chronic ischemic white matter changes.  Impression: 77yo male presenting with nausea and vomiting associated with vertigo.  This was initially attributed to a peripheral process, but a neurological consultation was obtained and there was no relief from medical management of the peripheral vertigo.  He continues to have this very bizarre and chaotic looking vertigo in all directions. There is a new lung mass that was diagnosed, biopsy results of which are pending at this time-but preliminary report per primary team is suggestive of non-small cell cancer. Paraneoplastic labs are also pending at this time. Other than considering this to be a paraneoplastic process, There could be a component of a neurodegenerative process causing the nystagmus but now with the lung mass likely being malignant, paraneoplastic etiology is more likely.  Also the fact that 2 days after IVIG completion, the nystagmus seems to be getting better, I would expect this to get better over the next 1 to 2 weeks as that is the timeframe that he would expect IVIG to take full effect.  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

## 2018-05-13 NOTE — Progress Notes (Signed)
Medicine attending: I examined this patient today together with resident physician Dr. Guadlupe Spanish and I concur with his evaluation and management plan. We appreciate input from neurology and physical therapy. Minimal ability to mobilize the patient today but the patient seems agreeable to continue with a physical therapy program. Repeat MRI with higher resolution shows numerous areas of microvascular hemorrhage not demonstrated on the initial study which could certainly be contributing to his nystagmus.  Possibility of senile amyloid vasculopathy raised.  However, it is also true that his nystagmus has improved somewhat since receiving intravenous immunoglobulin and that this may still be an autoimmune or paraneoplastic phenomenon. Although the preliminary lung biopsy was read as non-small cell lung cancer, after I discussed the patient's clinical history with the pathologists, they are taking a another look at the specimen and doing additional stains to exclude the possibility that this is small cell lung cancer. I discussed with the patient's younger son today that regardless of the pathology and what appears to be disease limited to the chest, functional status is a major determinant of whether or not somebody will be capable of undergoing an aggressive treatment program.  He is not a candidate for surgery.  He is a potential candidate for radiation if this is non-small cell lung cancer and potential candidate for chemo if it is small cell lung cancer but his performance status would need to improve before this would be a realistic consideration. We have arranged for a family meeting with his 2 sons and his wife for tomorrow afternoon to review treatment options to include a no treatment, palliative/supportive care option as well.

## 2018-05-13 NOTE — Progress Notes (Signed)
   Subjective: Discussed with Mr. Sermeno and his son the results of his biopsy.  We discussed some of the options available for treatment vs a more palliative approach and will discuss this further after the family has had a chance to convene.    Objective:  Vital signs in last 24 hours: Vitals:   05/12/18 1450 05/12/18 2130 05/12/18 2200 05/13/18 0528  BP: 140/69 (!) 185/97 (!) 160/80 (!) 167/90  Pulse: 85 75  75  Resp: 18 18  17   Temp: 97.6 F (36.4 C) 97.8 F (36.6 C)  98.2 F (36.8 C)  TempSrc: Oral Oral  Oral  SpO2: 92% 94%  93%  Weight:      Height:      Physical Exam  Constitutional: He appears well-developed and well-nourished.  Eyes: Right eye exhibits no discharge. Left eye exhibits no discharge. No scleral icterus.  Cardiovascular: Normal rate, regular rhythm and normal heart sounds. Exam reveals no gallop and no friction rub.  No murmur heard. Pulmonary/Chest: Effort normal. No respiratory distress. He has decreased breath sounds in the left lower field. He has no wheezes.  Abdominal: Soft. Bowel sounds are normal. He exhibits no distension and no mass. There is no guarding.  Neurological: He is alert.  Horizontal nystagmus appreciated with mild improvement worse when moving eyes vertically.      Assessment/Plan:   Vertigo: cause is still uncertain, dizziness and nausea/vomiting significantly improved after vestibular PT on HD#1 but have since returned and remains severe. Will continue vestibular PT. 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   -repeat MRI shows no brainstem lesions but dentate nuclei hemorrhage, suggestive of cerebral amyloid angiopathy could be reason for nystagmus -Vestibular PT, pt using tilt bed -Meclizine 25 mg TID PRN -zofran PRN for nausea -IR biopsy results shows non-small cell carcinoma, special stains pending -confirmed that  paraneoplastic autoantibody testing was sent off by our lab, I do see a cancellation order in from [facility] in the order history will investigate further. 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   -will need PET CT outpt along with oncology referral   COPD w/ Panlobular emphysema: On rescue inhaler and flovent at home  -continue budesonide nebs  -duonebs PRN Q8  PAF: diagnosed this admission  -continue diltiazem 24 hr 120 mg daily, eliquis 5mg  BID  BPH  -Continuehome doxazosin and finasteride daily  Constipation: pt immobilized for long period, has had poor oral intake.  -pt refused enema yesterday -will add dulcolax today, continuing osmotics    Dispo: Anticipated discharge in approximately 1-2 day(s).   [name], MD 05/13/2018, 7:18 AM [name] MD PGY-1 Internal Medicine Pager # [phone]

## 2018-05-13 NOTE — Progress Notes (Signed)
Physical Therapy Treatment Patient Details Name: Reginald [name] MRN: [id number] DOB: [birth year] Today's Date: 05/13/2018    History of Present Illness Reginald [name] is a 81 to M with Hx of AAA (s/p stent in 2016), COPD, Asthma, DVT (2005), and R heel fx who presented with nausea and vomiting for several days PTA. Vertigo is thought to be BPPV vs progressive cerebellar dysfunction vs paraneoplatic syndrome, newly dx lung mass due for bx on 05/09/18, L LL PNA, and new onset A-fib.  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     PT Comments    Pt was agreeable to a little mobility today and even mentioned "maybe" trying to get up OOB to the chair tomorrow.  I think even if the total lift was used to accomplish this change of position would be good for him.  PT will follow up tomorrow to see if I can help facilitate movement OOB safely.  ROM to all 4 extremities preformed today and pt positioned in chair position in the bed with head up and feet down at the end of my session.  Pt reporting better visual focus, but continues to have nausea with mobility despite RN pre medicating him.     Follow Up Recommendations  SNF     Equipment Recommendations  Hospital bed;Wheelchair cushion (measurements PT);Wheelchair (measurements PT);Other (comment)(hoyer lift)    Recommendations for Other Services   NA     Precautions / Restrictions Precautions Precautions: Fall Precaution Comments: nausea/movement sensitive    Mobility  Bed Mobility Overal bed mobility: Needs Assistance Bed Mobility: Rolling(sliding up in bed) Rolling: Min assist         General bed mobility comments: Min assist to reposition on his back so he could sit up and drink a chocolate ensure.  Two person max assist to move up to [facility] in supine.  Pt attempting to  help, but mostly done by PT and his son.    Transfers                 General transfer comment: Pt declined attempting standing tilt in bed today.  He did voice wanting to try to get up OOB tomorrow (and then said, "maybe").              Cognition Arousal/Alertness: Awake/alert Behavior During Therapy: Flat affect(at times he gets frustrated and angry ) Overall Cognitive Status: Within Functional Limits for tasks assessed                                        Exercises General Exercises - Upper Extremity Shoulder Flexion: AAROM;Both;10 reps Elbow Flexion: AAROM;Both;10 reps Wrist Flexion: AAROM;Both;10 reps General Exercises - Lower Extremity Ankle Circles/Pumps: AAROM;Both;10 reps Heel Slides: AAROM;Both;10 reps Hip ABduction/ADduction: AAROM;Both;10 reps Other Exercises Other Exercises: Assisted pt in hand over hand self feeding to drink the ensure.  He did better (less tremorous) with his elbows tucked at his sides.  Left pt in "chair position" in bed with head up and feet down to increase his upright posture/time as tolerated.          Pertinent Vitals/Pain Pain Assessment: No/denies pain(reports 3-4/10 nausea)           PT Goals (  current goals can now be found in the care plan section) Acute Rehab PT Goals Patient Stated Goal: to go home, and to have some of the neurologic symptoms ease. Progress towards PT goals: Progressing toward goals    Frequency    Min 3X/week      PT Plan Frequency needs to be updated       AM-PAC PT "6 Clicks" Daily Activity  Outcome Measure  Difficulty turning over in bed (including adjusting bedclothes, sheets and blankets)?: A Little Difficulty moving from lying on back to sitting on the side of the bed? : Unable Difficulty sitting down on and standing up from a chair with arms (e.g., wheelchair, bedside commode, etc,.)?: Unable Help needed moving to and from a bed to chair (including a wheelchair)?:  Total Help needed walking in hospital room?: Total Help needed climbing 3-5 steps with a railing? : Total 6 Click Score: 8    End of Session   Activity Tolerance: Other (comment)(limited by nausea) Patient left: in bed;with call bell/phone within reach;with family/visitor present   PT Visit Diagnosis: Unsteadiness on feet (R26.81);Muscle weakness (generalized) (M62.81);Dizziness and giddiness (R42) BPPV - Right/Left : Right;Left     Time: 1798-1025 PT Time Calculation (min) (ACUTE ONLY): 36 min  Charges:  $Therapeutic Exercise: 8-22 mins $Therapeutic Activity: 8-22 mins          Amybeth Sieg B. [place], [place], DPT [phone]            05/13/2018, 2:12 PM

## 2018-05-13 NOTE — Plan of Care (Signed)
Pt continues to change position frequently with 1 person assist.  Pt is agreeable to drinking ensures and taking a few bites at each meal.

## 2018-05-14 DIAGNOSIS — K219 Gastro-esophageal reflux disease without esophagitis: Secondary | ICD-10-CM

## 2018-05-14 DIAGNOSIS — C342 Malignant neoplasm of middle lobe, bronchus or lung: Secondary | ICD-10-CM

## 2018-05-14 DIAGNOSIS — R0989 Other specified symptoms and signs involving the circulatory and respiratory systems: Secondary | ICD-10-CM

## 2018-05-14 DIAGNOSIS — R1013 Epigastric pain: Secondary | ICD-10-CM

## 2018-05-14 DIAGNOSIS — C3410 Malignant neoplasm of upper lobe, unspecified bronchus or lung: Secondary | ICD-10-CM

## 2018-05-14 LAB — BASIC METABOLIC PANEL
Anion gap: 4 — ABNORMAL LOW (ref 5–15)
BUN: 23 mg/dL — AB (ref 6–20)
CHLORIDE: 108 mmol/L (ref 101–111)
CO2: 25 mmol/L (ref 22–32)
CREATININE: 1.11 mg/dL (ref 0.61–1.24)
Calcium: 8.4 mg/dL — ABNORMAL LOW (ref 8.9–10.3)
GFR calc Af Amer: >60 mL/min (ref >60)
GFR calc non Af Amer: >60 mL/min (ref >60)
GLUCOSE: 116 mg/dL — AB (ref 65–99)
Potassium: 3.7 mmol/L (ref 3.5–5.1)
Sodium: 137 mmol/L (ref 135–145)

## 2018-05-14 MED ORDER — HYDRALAZINE HCL 10 MG PO TABS
15.0000 mg | ORAL_TABLET | Freq: Two times a day (BID) | ORAL | Status: DC
  Administered 2018-05-14: 15 mg via ORAL
  Filled 2018-05-14 (×2): qty 2

## 2018-05-14 MED ORDER — UMECLIDINIUM-VILANTEROL 62.5-25 MCG/INH IN AEPB
1.0000 | INHALATION_SPRAY | Freq: Every day | RESPIRATORY_TRACT | Status: DC
  Filled 2018-05-14: qty 14

## 2018-05-14 MED ORDER — FLEET ENEMA 7-19 GM/118ML RE ENEM
1.0000 | ENEMA | Freq: Once | RECTAL | Status: AC
  Administered 2018-05-14: 1 via RECTAL
  Filled 2018-05-14: qty 1

## 2018-05-14 MED ORDER — KCL-LACTATED RINGERS 20 MEQ/L IV SOLN
INTRAVENOUS | Status: AC
  Administered 2018-05-14: 18:00:00 via INTRAVENOUS
  Filled 2018-05-14 (×3): qty 1000

## 2018-05-14 NOTE — Care Management Note (Signed)
Case Management Note  Patient Details  Name: Reginald [name] MRN: [id number] Date of Birth: May 12, 1941  Subjective/Objective:     Presented with acute vestibular syndrome, hx of   AAA (s/p stent in 2016), COPD, Asthma, DVT (2005), and R heel fx.    Yevonne Aline (Spouse) Zade Falkner [phone]     PCP: Kathryne Eriksson   Lung mass bx-preliminary suggestive of non-small cell lung cancer.   Action/Plan: Family meeting with MD today to review treatment options....palliative meeting scheduled for today as well for goals of care. NCM monitoring for disposition needs.  Expected Discharge Date:              Expected Discharge Plan:     In-House Referral:  Clinical Social Work  Discharge planning Services  CM Consult  Post Acute Care Choice:    Choice offered to:     DME Arranged:    DME Agency:     HH Arranged:    HH Agency:     Status of Service:  In process, will continue to follow  If discussed at Long Length of Stay Meetings, dates discussed:    Additional Comments:  Sharin Mons, RN 05/14/2018, 10:04 AM

## 2018-05-14 NOTE — Progress Notes (Signed)
NEURO HOSPITALIST Sign-off NOTE  Subjective: Patient in bed asleep, eyes closed, NAD on 2 L [place]. Son at bedside. Patient easily arouses to name and light touch. Nystagmus improved since yesterday.   Exam: Vitals:   05/13/18 2343 05/14/18 0506  BP: (!) 147/83 (!) 143/76  Pulse: 84 81  Resp: 19 19  Temp: 98.2 F (36.8 C) 98.2 F (36.8 C)  SpO2: 91% 95%  Physical Exam  HEENT-  Normocephalic, no lesions, without obvious abnormality.  Normal external eye and conjunctiva.   Cardiovascular- S1-S2 audible, pulses palpable throughout   Lungs-no rhonchi or wheezing noted, no excessive working breathing.  Saturations within normal limits on 2L [place] Abdomen- BS present in all 4 quadrants Extremities- Warm, dry and intact, SCD on bilaterally Musculoskeletal-no joint tenderness, deformity or swelling Skin-warm and dry,intact Neuro:  Mental Status: Asleep, drowsy , but oriented. Speech fluent without evidence of aphasia.  Able to follow simple commands without difficulty. Cranial Nerves: II:  Visual fields grossly normal,  III,IV, VI: ptosis not present, extra-ocular motions intact bilaterally. Nystagmus improved. Still worse on upward gaze. Nystagmus not seen at rest when eyes closed today; only when eyes open and upward gaze nystagmus still the worst. pupils equal, round. reactive to light and accommodation  V,VII: smile symmetric, facial light touch sensation normal bilaterally VIII: hearing normal bilaterally IX,X: uvula rises symmetrically XI: bilateral shoulder shrug XII: midline tongue extension, rhytmic motions that coincide with nystagmus that have decreased in intensity.  No palatal tremor. Motor: Right : Upper extremity   5/5    Left:     Upper extremity   5/5  Lower extremity   5/5     Lower extremity   5/5 Tone and bulk:normal tone throughout; no atrophy noted Sensory:  light touch intact throughout, bilaterally Deep Tendon Reflexes: 2+ and symmetric  throughout Plantars: Right: downgoing   Left: downgoing Cerebellar: Mild ataxia noted with finger-to-nose,  and normal heel-to-shin test Gait: not tested    Medications:  Scheduled: . apixaban  5 mg Oral BID  . budesonide (PULMICORT) nebulizer solution  0.25 mg Nebulization Daily  . diltiazem  120 mg Oral Daily  . doxazosin  4 mg Oral QHS  . feeding supplement (ENSURE ENLIVE)  237 mL Oral BID BM  . finasteride  5 mg Oral QHS  . hydrALAZINE  10 mg Oral q12n4p  . mouth rinse  15 mL Mouth Rinse BID  . nystatin  5 mL Oral QID  . pantoprazole  40 mg Oral Daily  . polyethylene glycol  17 g Oral Daily   EVO:[id number] **OR** acetaminophen, alum & mag hydroxide-simeth, calcium carbonate, ipratropium-albuterol, meclizine, metoprolol tartrate, ondansetron (ZOFRAN) IV, senna-docusate  Pertinent Labs/Diagnostics: MRI Brain  05-12-18  IMPRESSION: 1. Numerous bilateral, peripheral-predominant chronic microhemorrhages in a pattern consistent with cerebral amyloid angiopathy. This is much more clearly demonstrated on the current examination due to the increased sensitivity of susceptibility-weighted imaging compared to traditional gradient echo sequences. 2. 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 Certain conditions of neurodegeneration with brain iron accumulation (e.g. Woodhouse-Sakati syndrome) may also cause this appearance, but are exceedingly rare. The appearance of the brainstem itself is normal. 3. Severe white matter changes secondary to chronic ischemic microangiopathy. Generalized atrophy. 4. 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 Electronically Signed   By: Ulyses Jarred  M.D.   On: 05/12/2018 18:51   Biopsy results : pending Paraneoplastic: was canceled; insufficient specimen received  Laurey Morale, MSN, NP-C [facility] [phone]  Attending  neurologist's note to follow  Attending addendum I have seen and examined the patient I have independently reviewed images..  Impression: 77yo male presenting with nausea and vomiting associated with vertigo.This was initially attributed to a peripheral process, but a neurological consultation was obtained and there was no relief from medical management of the peripheral vertigo. He continues to have this very bizarre and chaotic looking vertigo in all directions. There is a new lung mass that was diagnosed, biopsy results of which are pending at this time-but preliminary report per primary team is suggestive of small cell vs non-small cell cancer.   Paraneoplastic labs ordered but seems like canceled due to insufficient sample. Requested primary team to follow.  Other than considering this to be a paraneoplastic process,there could be a component of a neurodegenerative process causing the nystagmus but now with the lung mass likely being malignant, paraneoplastic etiology is more likely.  Also the fact that 2 days after IVIG completion, the nystagmus seems to be getting better, I would expect this to get better over the next 1 to 2 weeks as that is the timeframe that he would expect IVIG to take full effect.  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 Management of the lung mass per primary team as you are. --Vestibular rehab might be helpful for symptomatic relief. --Outpatient neurology follow up in 2-4 weeks after discharged.  Neurology will be available as needed. Please call with questions.              -- Amie Portland, MD [facility] Pager: [phone] If 7pm to 7am, please call on call as listed on AMION.     05/14/2018, 8:32 AM

## 2018-05-14 NOTE — Progress Notes (Signed)
   Subjective: Reginald [name] feels like his nystagmus is vertigo has had some mild improvement.  He does not feel short of breath, has no abdominal pain or discomfort.    Objective:  Vital signs in last 24 hours: Vitals:   05/14/18 0900 05/14/18 1004 05/14/18 1404 05/14/18 1406  BP: (!) 155/8 (!) 155/81  (!) 145/74  Pulse:   82 79  Resp:    20  Temp:    98.6 F (37 C)  TempSrc:    Oral  SpO2:   98% 98%  Weight:      Height:      Physical Exam  Constitutional: He appears well-developed and well-nourished.  Eyes: Right eye exhibits no discharge. Left eye exhibits no discharge. No scleral icterus.  Cardiovascular: Normal rate, regular rhythm and normal heart sounds. Exam reveals no gallop and no friction rub.  No murmur heard. Pulmonary/Chest: Effort normal. No respiratory distress. He has no wheezes. He has rales (fine) in the right lower field.  Abdominal: Soft. Bowel sounds are normal. He exhibits no distension and no mass. There is no guarding.  Neurological: He is alert.  Horizontal nystagmus appreciated with mild improvement worse when moving eyes vertically.      Assessment/Plan:   Vertigo: cause is still uncertain, dizziness and nausea/vomiting significantly improved after vestibular PT on HD#1 but have since returned and remains severe. Will continue vestibular PT. 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 repeat MRI shows no brainstem lesions but dentate nuclei hemorrhage, suggestive of cerebral amyloid angiopathy which may or may not be contributing to nystagmus  -Vestibular PT, pt using tilt bed -Meclizine 25 mg TID PRN -zofran PRN for nausea -confirmed that paraneoplastic autoantibody testing was sent off by our lab, wanted a repeat order placed because they could not see the specific tests that they wanted even though they were put into the comments  section. 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    -had meeting with family today, discussed patient's current clinical situation and options on how to proceed,  they will discuss treatment options vs palliative approach with patient - Dr. Earlie Server will see pt while he is in the hospital and discuss treatment plan with pt and family provided they agree to a non palliative approach.   - If decision to pursue chemotherapy is made we will try and expedite a transfer to [facility] long   COPD w/ Panlobular emphysema: On rescue inhaler and flovent at home  -will switch pt to anoro for maintenance therapy  -duonebs PRN Q8  PAF: diagnosed this admission  -continue diltiazem 24 hr 120 mg daily, eliquis 5mg  BID  BPH  -Continuehome doxazosin and finasteride daily  Constipation: pt immobilized for long period, has had poor oral intake.  -will try enema again today     Dispo: Anticipated discharge in approximately 1-2 day(s).   [name], MD 05/14/2018, 2:17 PM [name] MD PGY-1 Internal Medicine Pager # [phone]

## 2018-05-14 NOTE — Progress Notes (Signed)
Physical Therapy Treatment Patient Details Name: Reginald [name] MRN: [id number] DOB: [birth year] Today's Date: 05/14/2018    History of Present Illness Reginald [name] is a 23 to M with Hx of AAA (s/p stent in 2016), COPD, Asthma, DVT (2005), and R heel fx who presented with nausea and vomiting for several days PTA. Vertigo is thought to be BPPV vs progressive cerebellar dysfunction vs paraneoplatic syndrome, newly dx lung mass due for bx on 05/09/18, L LL PNA, and new onset A-fib.  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     PT Comments    Pt agreeable to OOB to chair today, but wanting to wait until both sons are present to help.  PT/OT to attempt to come back to help facilitate safe transfer to chair, possibly using the steady standing frame.  Educated son, Reginald [name] on 4 extremity exercise program given to other son, Reginald [name] last session.  Pt sitting up in bed feeding himself his Ensure.  Granddaughter present and helping with pt's morale.  PT will continue to follow acutely and encourage mobility as pt wants to go home and the more he can move and assist his sons in moving the easier it will be to take him home.  Son, Reginald [name], given [name] pager number to call when they are ready to try [facility].    Follow Up Recommendations  SNF     Equipment Recommendations  Hospital bed;Wheelchair cushion (measurements PT);Wheelchair (measurements PT);Other (comment)(hoyer lift)    Recommendations for Other Services   NA     Precautions / Restrictions Precautions Precautions: Fall Precaution Comments: nausea/movement sensitive    Mobility  Bed Mobility Overal bed mobility: Needs Assistance Bed Mobility: Rolling Rolling: Min assist         General bed mobility comments: to reposition on his back to drink his ensure, two person  mod/max assist to scoot to [facility] using bed pad.                                           Cognition Arousal/Alertness: Awake/alert Behavior During Therapy: WFL for tasks assessed/performed(better today, even cracked a joke) Overall Cognitive Status: Within Functional Limits for tasks assessed                                        Exercises General Exercises - Upper Extremity Shoulder Flexion: AAROM;10 reps;Left Elbow Flexion: AAROM;10 reps;Left Wrist Flexion: AAROM;10 reps;Left General Exercises - Lower Extremity Ankle Circles/Pumps: AAROM;10 reps;Left Heel Slides: AAROM;10 reps;Left Hip ABduction/ADduction: AAROM;10 reps;Left    General Comments General comments (skin integrity, edema, etc.): Son Reginald [name] in room, educated him on 4 extremity ROM routine and informed that I gave Reginald [name] a handout.  Prefored routine on pt's left side so son could see what we have been working on in the bed.       Pertinent Vitals/Pain Pain Assessment: No/denies pain           PT Goals (current goals can now be found in the care plan section) Acute Rehab PT Goals Patient Stated Goal: to go home, and to have some of the neurologic symptoms  ease. Progress towards PT goals: Progressing toward goals    Frequency    Min 3X/week      PT Plan Current plan remains appropriate       AM-PAC PT "6 Clicks" Daily Activity  Outcome Measure  Difficulty turning over in bed (including adjusting bedclothes, sheets and blankets)?: A Little Difficulty moving from lying on back to sitting on the side of the bed? : Unable Difficulty sitting down on and standing up from a chair with arms (e.g., wheelchair, bedside commode, etc,.)?: Unable Help needed moving to and from a bed to chair (including a wheelchair)?: Total Help needed walking in hospital room?: Total Help needed climbing 3-5 steps with a railing? : Total 6 Click Score: 8    End of Session Equipment Utilized  During Treatment: Oxygen Activity Tolerance: Patient limited by fatigue Patient left: in bed;with call bell/phone within reach;with family/visitor present Nurse Communication: Mobility status PT Visit Diagnosis: Unsteadiness on feet (R26.81);Muscle weakness (generalized) (M62.81);Dizziness and giddiness (R42) BPPV - Right/Left : Right;Left     Time: 4944-9675 PT Time Calculation (min) (ACUTE ONLY): 23 min  Charges:  $Therapeutic Activity: 8-22 mins $Self Care/Home Management: 8-22          Reginald [name] B. Dunbar, [place], DPT [phone]            05/14/2018, 2:03 PM

## 2018-05-14 NOTE — Progress Notes (Signed)
Medicine attending: I examined this patient today together with resident physician Dr. Guadlupe Spanish and I concur with his evaluation and management plan which we discussed together. No acute clinical change.  Patient does feel that his nystagmus has improved following intravenous immunoglobulin. 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  This changes the management approach given that this subtype of cancer is exquisitely sensitive to chemotherapy and may be the best palliation. We had an extended meeting with the patient's 2 sons, wife, and granddaughter. Is poor performance status and the neurologic findings are likely a direct result of this cancer and there is a reasonable chance that he will respond to chemotherapy with improvement in his performance status and the quality of his life.  He has no other major organ dysfunction. He does have new onset atrial fibrillation but rate is now well controlled on current medicine regimen. Echocardiogram done this admission shows normal left ventricular function with ejection fraction 60-65% and no wall motion abnormalities. I have discussed this patient with my oncology colleague Dr. Earlie Server and he will evaluate the patient tomorrow.  If the patient is otherwise agreeable, I would anticipate transfer to [facility] long hospital and proceed with the first cycle of chemotherapy as an inpatient. We reviewed with the family that the ultimate choice belongs to the patient and if he would rather follow a more palliative approach, we would support him and get hospice involved.

## 2018-05-14 NOTE — Progress Notes (Signed)
OT Cancellation Note  Patient Details Name: JOUSHUA DUGAR MRN: [id number] DOB: 08-13-1941   Cancelled Treatment:    Reason Eval/Treat Not Completed: Other (comment). Family meeting this afternoon. Spoke with son and he would like to hold therapy this afternoon to give pt some time to rest and take in information discussed in family meeting. Will follow up as time allows.  Earlean Shawl A. Ulice Brilliant, M.S., OTR/L Acute Rehab Department: [phone]  05/14/2018, 4:46 PM

## 2018-05-14 NOTE — Consult Note (Signed)
[facility] [phone]   [phone]  CONSULT NOTE  REFERRING PHYSICIAN: Dr. Murriel Hopper  REASON FOR CONSULTATION:  77 years old white male recently diagnosed with lung cancer.  HPI Reginald [name] is a 77 y.o. male with past medical history significant for abdominal aortic aneurysm, COPD, deep venous thrombosis of the right leg, anemia, vertigo, asthma as well as arthritis and cluster headaches in addition to a long history of smoking.  The patient was recently diagnosed with a deep venous thrombosis 4 days before his admission when he presented with nausea and vomiting as well as vertigo and generalized weakness.  He was diagnosed with acute vestibular syndrome.  CT scan of the head without contrast on 528 followed by MRI of the brain on the same date showed no acute intracranial process.  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  Chest x-ray on 05/03/2018 showed approximately 3.7 cm masslike opacity overlying the right upper lobe concerning for underlying bronchogenic carcinoma.  A region of masslike consolidation in the setting of pneumonia could appear similar.  This was followed by CT scan of the chest with contrast on 05/03/2018 and that showed a mass in the periphery of the right upper lobe posteriorly measuring 5.2 x 2.7 cm abutting the pleura without identified chest wall invasion.  This mass is concerning for malignancy.  There was one abnormal node in the mediastinum as well as borderline node in the right hilum likely metastatic.  There was also probable pneumonia in the left lung base.  CT-guided core biopsy of the right upper lobe lung mass was performed by interventional radiology on 05/09/2018 and the final pathology ([id number]) was consistent with small cell carcinoma. The malignant cells are positive for CD56, synaptophysin, p63, and TTF-1. There is focal positive staining for chromogranin. They  are essentially negative for Napsin-A and cytokeratin 5/6. 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 The patient continues to complain of significant fatigue and weakness.  Repeat MRI of the brain with and without contrast on 05/12/2018 showed numerous bilateral peripheral predominant chronic microhemorrhages in a pattern consistent with cerebral amyloid angiopathy.  There was also increased susceptibility within both dentate nuclei likely due to chronic microhemorrhage of hypertensive angiopathy.  Certain condition of neurodegeneration with an accumulation Darlen Round Sakati I syndrome) may also cause this appearance.  Clearly there was no evidence of metastatic disease to the brain.  Dr. Arlyn Leak kindly asking me to see the patient today for evaluation and recommendation regarding treatment of his condition. When seen today the patient is complaining of increasing fatigue and weakness.  Intermittent.  He denied having any current chest pain, shortness of breath but has mild cough with no hemoptysis.  He denied having any fever or chills.  He has no headache. Family history significant for father with pancreatic cancer.  Mother had hypertension.  No other family history of malignancy. The patient is married and has 2 sons.,  His wife, son, daughter-in-law and granddaughter were at the bedside.  He has a history of smoking for her to quit a few times in the past.  No history of alcohol or drug abuse.  HPI  Past Medical History:  Diagnosis Date  . AAA (abdominal aortic aneurysm) ([place])   . Arthritis   . Asthma   . COPD (chronic obstructive pulmonary disease) ([place])   . DVT (deep venous thrombosis) ([place]) 2005   Right  leg  .  Headache    cluster headaches  . Hyperlipidemia   . Joint pain   . Shortness of breath dyspnea    exertion  . Vertigo 04/29/2018    Past Surgical History:  Procedure Laterality Date  . ABDOMINAL AORTIC ENDOVASCULAR STENT GRAFT N/A 01/26/2015   Procedure:  ABDOMINAL AORTIC ENDOVASCULAR STENT GRAFT;  Surgeon: Elam Dutch, MD;  Location: [facility];  Service: Vascular;  Laterality: N/A;  . FRACTURE SURGERY  2005   Right heel - Fx and NO SURGERY    Family History  Problem Relation Age of Onset  . Hyperlipidemia Mother   . Hypertension Mother   . Cancer Father        Bladder  . Hyperlipidemia Father   . Hypertension Father   . Kidney disease Father   . Heart disease Brother        Heart Disease before age 51  . Heart attack Brother        x's 2  . Hyperlipidemia Brother   . Hypertension Brother   . Hyperlipidemia Sister   . Hypertension Sister     Social History Social History   Tobacco Use  . Smoking status: Former Smoker    Packs/day: 1.00    Years: 30.00    Pack years: 30.00    Types: E-cigarettes, Cigarettes    Last attempt to quit: 03/05/2012    Years since quitting: 6.1  . Smokeless tobacco: Never Used  Substance Use Topics  . Alcohol use: No    Alcohol/week: 0.0 oz  . Drug use: No    No Known Allergies  Current Facility-Administered Medications  Medication Dose Route Frequency Provider Last Rate Last Dose  . acetaminophen (TYLENOL) tablet 650 mg  650 mg Oral Q6H PRN Alphonzo Grieve, MD       Or  . acetaminophen (TYLENOL) suppository 650 mg  650 mg Rectal Q6H PRN Alphonzo Grieve, MD      . alum & mag hydroxide-simeth (MAALOX/MYLANTA) 200-200-20 MG/5ML suspension 15 mL  15 mL Oral Q6H PRN Gilles Chiquito B, MD   15 mL at 05/10/18 1311  . apixaban (ELIQUIS) tablet 5 mg  5 mg Oral BID [name], MD   5 mg at 05/14/18 1004  . calcium carbonate (TUMS - dosed in mg elemental calcium) chewable tablet 200-400 mg of elemental calcium  1-2 tablet Oral TID PRN [name], MD   400 mg of elemental calcium at 05/10/18 0002  . diltiazem (CARDIZEM CD) 24 hr capsule 120 mg  120 mg Oral Daily Kroeger, Krista M., PA-C   120 mg at 05/14/18 1004  . doxazosin (CARDURA) tablet 4 mg  4 mg Oral QHS Alphonzo Grieve, MD   4 mg  at 05/13/18 2208  . feeding supplement (ENSURE ENLIVE) (ENSURE ENLIVE) liquid 237 mL  237 mL Oral BID BM [name], MD   237 mL at 05/14/18 1357  . finasteride (PROSCAR) tablet 5 mg  5 mg Oral QHS Alphonzo Grieve, MD   5 mg at 05/13/18 2208  . hydrALAZINE (APRESOLINE) tablet 15 mg  15 mg Oral q12n4p [name], MD   15 mg at 05/14/18 1959  . ipratropium-albuterol (DUONEB) 0.5-2.5 (3) MG/3ML nebulizer solution 3 mL  3 mL Nebulization Q8H PRN Guadlupe Spanish B, MD      . lactated ringers with KCl 20 mEq/L infusion   Intravenous Continuous [name], MD 75 mL/hr at 05/14/18 1750    . meclizine (ANTIVERT) tablet 25 mg  25  mg Oral TID PRN [name], MD   25 mg at 05/13/18 8416  . MEDLINE mouth rinse  15 mL Mouth Rinse BID Oda Kilts, MD   15 mL at 05/14/18 1005  . nystatin (MYCOSTATIN) 100000 UNIT/ML suspension 500,000 Units  5 mL Oral QID Kathi Ludwig, MD   500,000 Units at 05/14/18 1753  . ondansetron (ZOFRAN) injection 4 mg  4 mg Intravenous Q8H PRN Nedrud, Larena Glassman, MD   4 mg at 05/14/18 1456  . pantoprazole (PROTONIX) EC tablet 40 mg  40 mg Oral Daily [name], MD   40 mg at 05/14/18 1005  . polyethylene glycol (MIRALAX / GLYCOLAX) packet 17 g  17 g Oral Daily [name], MD   17 g at 05/14/18 1003  . senna-docusate (Senokot-S) tablet 1-2 tablet  1-2 tablet Oral BID PRN [name], MD   1 tablet at 05/14/18 1456  . [START ON 05/15/2018] umeclidinium-vilanterol (ANORO ELLIPTA) 62.5-25 MCG/INH 1 puff  1 puff Inhalation Daily Winfrey, Jenne Pane, MD        Review of Systems  Constitutional: positive for anorexia, fatigue and weight loss Eyes: positive for visual disturbance Ears, nose, mouth, throat, and face: negative Respiratory: positive for cough and dyspnea on exertion Cardiovascular: negative Gastrointestinal: negative Genitourinary:negative Integument/breast: negative Hematologic/lymphatic:  negative Musculoskeletal:positive for muscle weakness Neurological: positive for vertigo and weakness Behavioral/Psych: negative Endocrine: negative Allergic/Immunologic: negative  Physical Exam  SAY:TKZSW, healthy, no distress, well nourished and well developed SKIN: skin color, texture, turgor are normal, no rashes or significant lesions HEAD: Normocephalic, No masses, lesions, tenderness or abnormalities EYES: normal EARS: External ears normal, Canals clear OROPHARYNX:no exudate, no erythema and lips, buccal mucosa, and tongue normal  NECK: supple, no adenopathy, no JVD LYMPH:  no palpable lymphadenopathy, no hepatosplenomegaly LUNGS: clear to auscultation , and palpation HEART: regular rate & rhythm, no murmurs and no gallops ABDOMEN:abdomen soft, non-tender, normal bowel sounds and no masses or organomegaly BACK: Back symmetric, no curvature., No CVA tenderness EXTREMITIES:no joint deformities, effusion, or inflammation, no edema  NEURO: alert & oriented x 3 with fluent speech, no focal motor/sensory deficits  PERFORMANCE STATUS: ECOG 2  LABORATORY DATA: Lab Results  Component Value Date   WBC 7.6 05/12/2018   HGB 14.8 05/12/2018   HCT 41.2 05/12/2018   MCV 96.7 05/12/2018   PLT 212 05/12/2018    @LASTCHEM @  RADIOGRAPHIC STUDIES: Ct Head Wo Contrast  Result Date: 04/29/2018 CLINICAL DATA:  Nausea, emesis and fatigue beginning 3 days ago. EXAM: CT HEAD WITHOUT CONTRAST TECHNIQUE: Contiguous axial images were obtained from the base of the skull through the vertex without intravenous contrast. COMPARISON:  12/01/2012 FINDINGS: Brain: Generalized brain atrophy. Extensive chronic small-vessel ischemic changes throughout the cerebral hemispheric white matter. 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 No sign of acute infarction, mass lesion, hemorrhage, hydrocephalus or extra-axial collection. Findings are slightly progressive since 2013. Vascular: There is atherosclerotic  calcification of the major vessels at the base of the brain. Skull: Negative Sinuses/Orbits: Clear/normal Other: None IMPRESSION: No acute finding by CT. Advanced chronic appearing small vessel ischemic changes of the cerebral hemispheric white matter, somewhat progressive since 2013. 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 Electronically Signed   By: Nelson Chimes M.D.   On: 04/29/2018 17:02   Ct Chest W Contrast  Result Date: 05/03/2018 CLINICAL DATA:  Mass in the right lung seen on chest x-ray. EXAM: CT CHEST WITH CONTRAST TECHNIQUE: Multidetector CT imaging of the chest was  performed during intravenous contrast administration. CONTRAST:  167mL OMNIPAQUE IOHEXOL 300 MG/ML  SOLN COMPARISON:  None. FINDINGS: Cardiovascular: The heart size is normal. Atherosclerotic changes seen in the thoracic aorta without dissection. A gentle bulge along the arch on image 62 is identified. No gross aneurysm. Central pulmonary arteries are normal in caliber with no obvious filling defects. Mediastinum/Nodes: The thyroid and esophagus are normal. An enlarged node is seen in the precarinal region with a short axis measurement of 2.7 cm. A mildly prominent right hilar node is seen on image 89 measuring 13 mm in short axis. No other definitive adenopathy. No effusions. Lungs/Pleura: Central airways are normal. No pneumothorax. There is patchy opacity in left base. While there is certainly a component of atelectasis, I suspect infiltrate as well. There is a mass in the periphery of the right upper lobe posteriorly abutting the pleura measuring 5.2 by 2.7 cm. No definitive invasion into the adjacent chest wall. No other nodules or masses. Severe emphysematous changes in the lungs. Upper Abdomen: A small nodule posterior to the spleen may represent a splenule. There is a cyst in the upper pole of the right kidney. Adrenal glands are normal. No other acute abnormalities. Musculoskeletal: No evidence of bony metastatic disease.  IMPRESSION: 1. There is a mass in the periphery of the right upper lobe posteriorly measuring 5.2 x 2.7 cm abutting the pleura without identified chest wall invasion. This mass is concerning for malignancy. There is 1 abnormal node in the mediastinum as well as a borderline node in the right hilum. I suspect at least 1 of these nodes is likely metastatic. Recommend a PET-CT for evaluation of the lung mass and lymph nodes. The mass will likely ultimately require tissue sampling for diagnosis. 2. Probable pneumonia in the left lung base. There is also a component of atelectasis. Recommend follow-up to resolution. 3. Atherosclerotic changes in the thoracic aorta. 4. Emphysematous changes in the lungs. Aortic Atherosclerosis (ICD10-I70.0) and Emphysema (ICD10-J43.9). Electronically Signed   By: Dorise Bullion III M.D   On: 05/03/2018 18:00   Mr Brain Wo Contrast  Result Date: 04/29/2018 CLINICAL DATA:  Nausea and vomiting, fatigue for 3 days. Assess persistent central vertigo. EXAM: MRI HEAD WITHOUT CONTRAST TECHNIQUE: Multiplanar, multiecho pulse sequences of the brain and surrounding structures were obtained without intravenous contrast. COMPARISON:  CT HEAD Apr 29, 2018 FINDINGS: INTRACRANIAL CONTENTS: No reduced diffusion to suggest acute ischemia. Faint punctate susceptibility artifact RIGHT cerebrum may be extra-axial. The ventricles and sulci are normal for patient's age. No mass effect or masses. Confluent supratentorial white matter FLAIR T2 hyperintensities. 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 No abnormal extra-axial fluid collections. No extra-axial masses. VASCULAR: Normal major intracranial vascular flow voids present at skull base. SKULL AND UPPER CERVICAL SPINE: No abnormal sellar expansion. No suspicious calvarial bone marrow signal. Craniocervical junction maintained. SINUSES/ORBITS: RIGHT frontal mucosal retention cysts in trace paranasal sinus mucosal thickening without air-fluid levels.  Mastoid air cells are well aerated. Included ocular globes and orbital contents are non-suspicious. OTHER: None. IMPRESSION: 1. No acute intracranial process. 2. 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 3. Severe chronic small vessel ischemic changes. Old small cerebellar infarcts. Electronically Signed   By: Elon Alas M.D.   On: 04/29/2018 20:33   Mr Jeri Cos PY Contrast  Result Date: 05/12/2018 CLINICAL DATA:  Nystagmus and vertigo/dizziness. EXAM: MRI HEAD WITHOUT AND WITH CONTRAST TECHNIQUE: Multiplanar, multiecho pulse sequences of the brain and surrounding structures were obtained without and with  intravenous contrast. CONTRAST:  74mL MULTIHANCE GADOBENATE DIMEGLUMINE 529 MG/ML IV SOLN COMPARISON:  Brain MRI 04/29/2018 FINDINGS: BRAIN: There is no acute infarct, acute hemorrhage or mass effect. The midline structures are normal. Diffuse confluent hyperintense T2-weighted signal within the periventricular, deep and juxtacortical white matter, most commonly due to chronic ischemic microangiopathy. There are old, small infarcts within both hemispheres of the cerebellum. There is also an old posterior left insular infarct. Generalized atrophy without lobar predilection. Susceptibility weighted imaging shows extensive peripheral chronic microhemorrhage bilaterally, but much worse on the right, predominantly involving the temporal and parietal lobes. Hemosiderin deposition in the dentate nuclei. No abnormal contrast enhancement. VASCULAR: Major intracranial arterial and venous sinus flow voids are preserved. SKULL AND UPPER CERVICAL SPINE: The visualized skull base, calvarium, upper cervical spine and extracranial soft tissues are normal. SINUSES/ORBITS: No fluid levels or advanced mucosal thickening. No mastoid or middle ear effusion. The orbits are normal. IMPRESSION: 1. Numerous bilateral, peripheral-predominant chronic microhemorrhages in a pattern  consistent with cerebral amyloid angiopathy. This is much more clearly demonstrated on the current examination due to the increased sensitivity of susceptibility-weighted imaging compared to traditional gradient echo sequences. 2. 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 Certain conditions of neurodegeneration with brain iron accumulation (e.g. Woodhouse-Sakati syndrome) may also cause this appearance, but are exceedingly rare. The appearance of the brainstem itself is normal. 3. Severe white matter changes secondary to chronic ischemic microangiopathy. Generalized atrophy. 4. 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 Electronically Signed   By: Ulyses Jarred M.D.   On: 05/12/2018 18:51   Ct Biopsy  Result Date: 05/09/2018 INDICATION: 77 year old with right upper lobe lung mass. Tissue diagnosis is needed. EXAM: CT-GUIDED CORE BIOPSY OF RIGHT UPPER LOBE LUNG MASS MEDICATIONS: None ANESTHESIA/SEDATION: Moderate (conscious) sedation was employed during this procedure. A total of Versed 1 mg and Fentanyl 75 mcg was administered intravenously. Moderate Sedation Time: 16 minutes. The patient's level of consciousness and vital signs were monitored continuously by radiology nursing throughout the procedure under my direct supervision. FLUOROSCOPY TIME:  None COMPLICATIONS: None immediate. PROCEDURE: Informed written consent was obtained from the patient and his son after a thorough discussion of the procedural risks, benefits and alternatives. All questions were addressed. A timeout was performed prior to the initiation of the procedure. Patient was placed prone on the CT scanner. The lesion in the right upper lobe was identified. Right side of the back was prepped and draped in a sterile fashion. Skin and soft tissues were anesthetized with 1% lidocaine. 17 gauge coaxial needle was directed into the pleural  based lesion with CT guidance. Needle position was confirmed within the lesion. A total of 3 core biopsies were obtained with an 18 gauge core device. Specimens placed in formalin. 17 gauge needle was removed without complication. Bandage placed over the puncture site. IMPRESSION: Successful CT-guided core biopsies of the right upper lobe pleural-based mass. Electronically Signed   By: Markus Daft M.D.   On: 05/09/2018 13:49   Dg Chest Port 1 View  Result Date: 05/09/2018 CLINICAL DATA:  Status post CT-guided lung biopsy. EXAM: PORTABLE CHEST 1 VIEW COMPARISON:  Chest CT dated 05/03/2018 and chest x-ray dated 05/03/2018. FINDINGS: RIGHT upper lobe mass is unchanged in the short-term interval. No hemothorax or pneumothorax seen. Coarse lung markings noted bilaterally indicating some degree of chronic interstitial lung disease/fibrosis, most prominent at the LEFT lung base. Heart size and mediastinal contours are  stable. IMPRESSION: 1. No pneumothorax, hemothorax or other evidence of post surgical complicating feature status post CT-guided lung biopsy. 2. RIGHT upper lobe mass, stable in the short-term interval. 3. Chronic interstitial lung disease/fibrosis. Electronically Signed   By: Franki Cabot M.D.   On: 05/09/2018 11:06   Dg Chest Port 1 View  Result Date: 05/03/2018 CLINICAL DATA:  77 year old male with rhonchi and cough EXAM: PORTABLE CHEST 1 VIEW COMPARISON:  Prior chest x-ray 01/26/2015 FINDINGS: Interval development of a 3.7 cm nodular opacity overlying the right upper lung. Stable borderline cardiomegaly. Bilateral bronchitic changes and bibasilar interstitial prominence are similar compared to prior. Atherosclerotic calcifications are present along the transverse aorta. No evidence of pleural effusion or pneumothorax. Upper lung predominant hyperlucency consistent with underlying emphysema. No acute osseous abnormality. Incompletely imaged aortic endograft. IMPRESSION: 1. Approximately 3.7 cm  masslike opacity overlying the right upper lung concerning for an underlying bronchogenic carcinoma. A region of masslike consolidation in the setting of pneumonia could appear similar. Recommend further evaluation with CT scan of the chest. 2. Stable cardiomegaly. 3. Stable chronic emphysematous, bronchitic and interstitial changes consistent with the clinical history of COPD. 4.  Aortic Atherosclerosis (ICD10-170.0) Electronically Signed   By: Jacqulynn Cadet M.D.   On: 05/03/2018 09:27   Dg Swallowing Func-speech Pathology  Result Date: 05/05/2018 Objective Swallowing Evaluation: Type of Study: MBS-Modified Barium Swallow Study  Patient Details Name: EMAAD NANNA MRN: [id number] Date of Birth: [birth year] Today's Date: 05/05/2018 Time: SLP Start Time (ACUTE ONLY): 1020 -SLP Stop Time (ACUTE ONLY): 7782 SLP Time Calculation (min) (ACUTE ONLY): 72 min Past Medical History: Past Medical History: Diagnosis Date . AAA (abdominal aortic aneurysm) ([place])  . Arthritis  . Asthma  . COPD (chronic obstructive pulmonary disease) ([place])  . DVT (deep venous thrombosis) ([place]) 2005  Right  leg . Headache   cluster headaches . Hyperlipidemia  . Joint pain  . Shortness of breath dyspnea   exertion . Vertigo 04/29/2018 Past Surgical History: Past Surgical History: Procedure Laterality Date . ABDOMINAL AORTIC ENDOVASCULAR STENT GRAFT N/A 01/26/2015  Procedure: ABDOMINAL AORTIC ENDOVASCULAR STENT GRAFT;  Surgeon: Elam Dutch, MD;  Location: [facility];  Service: Vascular;  Laterality: N/A; . FRACTURE SURGERY  2005  Right heel - Fx and NO SURGERY HPI: Lonzell Dorris is a 77 yo with PMH of AAA (s/p stent in 2016), COPD, Asthma, DVT (2005), and BPH who presented with vertigo, nausea, and vomiting. MD noting concern for aspiration PNA, lungs with rhonchi and productive cough. 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 F/u chest CT results pending. MRI 04/29/18 with no acute findings, old  cerebellar infarcts. Repeat imaging is being considered. Per PT notes, pt with new sudden onset opsiclonus-type eye movements, with ? medication side effect vs neurodegenerative etiology. MBSS scheduled following question of clinical s/s of dysphagia with thin liquid and likely LLL pna  Subjective: Pt awake, alert, participative.  Son present for MBSS Assessment / Plan / Recommendation CHL IP CLINICAL IMPRESSIONS 05/05/2018 Clinical Impression Pt presents with functional oropharyngeal swallowing.  There was no penetration or aspiration with any consistencies trialed.  There was mild vallecula residue with puree, increasing to moderate with regular solid which.  Residue was cleared with with liquid wash.  There was stasis of tablet revealed with esophageal sweep which cleared with repeat liquid wash. Recommend regular diet with thin liquid.  SLP to follow up for diet tolerance. SLP Visit Diagnosis Dysphagia, unspecified (R13.10) Attention and  concentration deficit following -- Frontal lobe and executive function deficit following -- Impact on safety and function --   CH[facility] IP TREATMENT RECOMMENDATION 05/05/2018 Treatment Recommendations No treatment recommended at this time   Prognosis 05/03/2018 Prognosis for Safe Diet Advancement Good Barriers to Reach Goals -- Barriers/Prognosis Comment -- CHL IP DIET RECOMMENDATION 05/05/2018 SLP Diet Recommendations Regular solids;Thin liquid Liquid Administration via Cup;Straw Medication Administration Whole meds with liquid Compensations (No Data) Postural Changes --   CH[facility] IP OTHER RECOMMENDATIONS 05/05/2018 Recommended Consults -- Oral Care Recommendations Oral care BID Other Recommendations --   [facility] IP FOLLOW UP RECOMMENDATIONS 05/05/2018 Follow up Recommendations None   CH[facility] IP FREQUENCY AND DURATION 05/03/2018 Speech Therapy Frequency (ACUTE ONLY) min 2x/week Treatment Duration 2 weeks      CHL IP ORAL PHASE 05/05/2018 Oral Phase WFL Oral - Pudding Teaspoon -- Oral - Pudding Cup -- Oral -  Honey Teaspoon -- Oral - Honey Cup -- Oral - Nectar Teaspoon -- Oral - Nectar Cup -- Oral - Nectar Straw -- Oral - Thin Teaspoon -- Oral - Thin Cup -- Oral - Thin Straw -- Oral - Puree -- Oral - Mech Soft -- Oral - Regular -- Oral - Multi-Consistency -- Oral - Pill -- Oral Phase - Comment --  CHL IP PHARYNGEAL PHASE 05/05/2018 Pharyngeal Phase -- Pharyngeal- Pudding Teaspoon -- Pharyngeal -- Pharyngeal- Pudding Cup -- Pharyngeal -- Pharyngeal- Honey Teaspoon -- Pharyngeal -- Pharyngeal- Honey Cup -- Pharyngeal -- Pharyngeal- Nectar Teaspoon -- Pharyngeal -- Pharyngeal- Nectar Cup -- Pharyngeal -- Pharyngeal- Nectar Straw -- Pharyngeal -- Pharyngeal- Thin Teaspoon -- Pharyngeal -- Pharyngeal- Thin Cup WFL Pharyngeal -- Pharyngeal- Thin Straw WFL Pharyngeal -- Pharyngeal- Puree Pharyngeal residue - valleculae Pharyngeal -- Pharyngeal- Mechanical Soft -- Pharyngeal -- Pharyngeal- Regular Pharyngeal residue - valleculae Pharyngeal -- Pharyngeal- Multi-consistency -- Pharyngeal -- Pharyngeal- Pill WFL Pharyngeal -- Pharyngeal Comment --  CHL IP CERVICAL ESOPHAGEAL PHASE 05/05/2018 Cervical Esophageal Phase WFL Pudding Teaspoon -- Pudding Cup -- Honey Teaspoon -- Honey Cup -- Nectar Teaspoon -- Nectar Cup -- Nectar Straw -- Thin Teaspoon -- Thin Cup -- Thin Straw -- Puree -- Mechanical Soft -- Regular -- Multi-consistency -- Pill -- Cervical Esophageal Comment -- No flowsheet data found. Burna Forts Borum 05/05/2018, 2:49 PM               ASSESSMENT: This is a very pleasant 77 years old white male recently diagnosed with small cell lung cancer, limited stage disease, pending further staging work-up diagnosed in June 2019 and presented with right upper lobe pleural-based mass in addition to right hilar and mediastinal lymphadenopathy. Unfortunately patient has very poor performance status with significant fatigue and weakness.  But the paraneoplastic in nature secondary to his a small cell lung cancer.  PLAN: I had a lengthy  discussion with the patient and his family members that were at the bedside today.  I personally and independently reviewed his imaging studies and discussed the result and showed the images to the family. I discussed with the patient his treatment options and give him the option of palliative care and hospice referral versus consideration of systemic chemotherapy initially with a reduced dose carboplatin for AUC 3-4 on day 1 and etoposide 80 mg/M2 on days 1, 2 and 3.  I discussed with the patient the adverse effect of this treatment including but not limited to alopecia, myelosuppression, nausea and vomiting, peripheral neuropathy, liver or renal dysfunction. It would be advisable to do his first treatment during his hospitalization  because of his significant fatigue and weakness at this point.  If the patient agreed to this plan he will need to be transferred to [facility] to start the treatment with chemotherapy there.  He may also benefit from concurrent radiotherapy in the future if there is no evidence of metastatic disease and his final staging is consistent with limited stage disease. The patient and his family would like some time to think about that option me know in the next few days. He will need to complete the staging work-up by ordering a PET scan on outpatient basis. For the fatigue and weakness, we will continue with the current supportive care and the patient may need discharge to a skilled nursing facility for rehabilitation after his treatment. I gave the patient and his family them to ask questions and I answered them completely to their satisfaction. The patient voices understanding of current disease status and treatment options and is in agreement with the current care plan.  All questions were answered. The patient knows to call the clinic with any problems, questions or concerns. We can certainly see the patient much sooner if necessary.  Thank you so much for allowing  me to participate in the care of [name]. I will continue to follow up the patient with you and assist in his care.  Disclaimer: This note was dictated with voice recognition software. Similar sounding words can inadvertently be transcribed and may not be corrected upon review.   Eilleen Kempf May 14, 2018, 9:36 PM

## 2018-05-14 NOTE — Progress Notes (Signed)
PT Cancellation Note  Patient Details Name: Reginald [name] MRN: [id number] DOB: [birth year]   Cancelled Treatment:    Reason Eval/Treat Not Completed: Other (comment) .  PT/OT checked back in on pt, spotting son, Reginald [name] in the hallway.  He reported that the family conference was this PM and that [name] (the pt) was processing all of what he heard.  Son, Reginald [name], would like PT/OT to check back in AM for attempt at OOB to chair.    Thanks,   Barbarann Ehlers. [place], [place], DPT [phone]   05/14/2018, 4:49 PM

## 2018-05-15 DIAGNOSIS — C3490 Malignant neoplasm of unspecified part of unspecified bronchus or lung: Secondary | ICD-10-CM

## 2018-05-15 DIAGNOSIS — G130 Paraneoplastic neuromyopathy and neuropathy: Secondary | ICD-10-CM

## 2018-05-15 DIAGNOSIS — J690 Pneumonitis due to inhalation of food and vomit: Secondary | ICD-10-CM

## 2018-05-15 LAB — CBC
HCT: 37.3 % — ABNORMAL LOW (ref 39.0–52.0)
HEMOGLOBIN: 13.3 g/dL (ref 13.0–17.0)
MCH: 33.8 pg (ref 26.0–34.0)
MCHC: 35.7 g/dL (ref 30.0–36.0)
MCV: 94.7 fL (ref 78.0–100.0)
Platelets: 190 10*3/uL (ref 150–400)
RBC: 3.94 MIL/uL — ABNORMAL LOW (ref 4.22–5.81)
RDW: 12.8 % (ref 11.5–15.5)
WBC: 8.4 10*3/uL (ref 4.0–10.5)

## 2018-05-15 LAB — BASIC METABOLIC PANEL
ANION GAP: 8 (ref 5–15)
BUN: 19 mg/dL (ref 6–20)
CHLORIDE: 104 mmol/L (ref 101–111)
CO2: 24 mmol/L (ref 22–32)
Calcium: 8.7 mg/dL — ABNORMAL LOW (ref 8.9–10.3)
Creatinine, Ser: 1.06 mg/dL (ref 0.61–1.24)
GFR calc Af Amer: >60 mL/min (ref >60)
Glucose, Bld: 107 mg/dL — ABNORMAL HIGH (ref 65–99)
Potassium: 4.1 mmol/L (ref 3.5–5.1)
Sodium: 136 mmol/L (ref 135–145)

## 2018-05-15 MED ORDER — ENSURE ENLIVE PO LIQD: 237.0000 mL | Freq: Two times a day (BID) | ORAL | 12 refills | Status: AC

## 2018-05-15 MED ORDER — SENNOSIDES-DOCUSATE SODIUM 8.6-50 MG PO TABS
1.0000 | ORAL_TABLET | Freq: Two times a day (BID) | ORAL | Status: AC | PRN
Start: 2018-05-15 — End: ?

## 2018-05-15 MED ORDER — UMECLIDINIUM-VILANTEROL 62.5-25 MCG/INH IN AEPB: 1.0000 | INHALATION_SPRAY | Freq: Every day | RESPIRATORY_TRACT | Status: AC

## 2018-05-15 MED ORDER — PANTOPRAZOLE SODIUM 40 MG PO TBEC: 40.0000 mg | DELAYED_RELEASE_TABLET | Freq: Every day | ORAL | Status: AC

## 2018-05-15 MED ORDER — MECLIZINE HCL 25 MG PO TABS: 25.0000 mg | ORAL_TABLET | Freq: Three times a day (TID) | ORAL | 0 refills | Status: AC | PRN

## 2018-05-15 MED ORDER — DILTIAZEM HCL ER COATED BEADS 120 MG PO CP24: 120.0000 mg | ORAL_CAPSULE | Freq: Every day | ORAL | Status: AC

## 2018-05-15 MED ORDER — CALCIUM CARBONATE ANTACID 500 MG PO CHEW: 1.0000 | CHEWABLE_TABLET | Freq: Three times a day (TID) | ORAL | Status: AC | PRN

## 2018-05-15 MED ORDER — POLYETHYLENE GLYCOL 3350 17 G PO PACK: 17.0000 g | PACK | Freq: Every day | ORAL | 0 refills | Status: AC

## 2018-05-15 MED ORDER — ALUM & MAG HYDROXIDE-SIMETH 200-200-20 MG/5ML PO SUSP: 15.0000 mL | Freq: Four times a day (QID) | ORAL | 0 refills | Status: AC | PRN

## 2018-05-15 MED ORDER — APIXABAN 5 MG PO TABS
5.0000 mg | ORAL_TABLET | Freq: Two times a day (BID) | ORAL | Status: AC
Start: 2018-05-15 — End: ?

## 2018-05-15 MED ORDER — ORAL CARE MOUTH RINSE: 15.0000 mL | Freq: Two times a day (BID) | OROMUCOSAL | 0 refills | Status: AC

## 2018-05-15 MED ORDER — NYSTATIN 100000 UNIT/ML MT SUSP: 5.0000 mL | Freq: Four times a day (QID) | OROMUCOSAL | 0 refills | Status: AC

## 2018-05-15 NOTE — Progress Notes (Signed)
Patient has been accepted by [facility]. Liaison will come by to speak with family and complete paperwork. Sons aware.  Reginald Locus Kymora Sciara LCSW [phone]

## 2018-05-15 NOTE — Progress Notes (Signed)
CSW continuing to follow for needs.  Percell Locus Raheen Capili LCSW [phone]

## 2018-05-15 NOTE — Progress Notes (Signed)
Patient will DC to: [facility] Anticipated DC date: 05/15/18 Family notified: Spouse and sons Transport by: PTAR 2:30pm   Per MD patient ready for DC to Hospice. RN, patient, patient's family, and facility notified of DC. Discharge Summary sent to facility. RN given number for report [phone]). DC packet on chart. Ambulance transport requested for patient.   CSW signing off.  Cedric Fishman, LCSW Clinical Social Worker [phone]

## 2018-05-15 NOTE — Progress Notes (Signed)
   Subjective: Reginald [name] denies any discomfort this am.  We spoke with him and his family, they have convened and he feels his best course of action is to pursue comfort care.  Objective:  Vital signs in last 24 hours: Vitals:   05/14/18 1404 05/14/18 1406 05/14/18 2126 05/15/18 0534  BP:  (!) 145/74 (!) 150/77 139/81  Pulse: 82 79 87 83  Resp:  20 20 18   Temp:  98.6 F (37 C) 98.4 F (36.9 C)   TempSrc:  Oral Oral Oral  SpO2: 98% 98% (!) 88% 96%  Weight:      Height:      Physical Exam  Constitutional: He appears well-developed and well-nourished.  HENT:  Head: Normocephalic and atraumatic.  Eyes: Right eye exhibits no discharge. Left eye exhibits no discharge. No scleral icterus.  Pulmonary/Chest: Effort normal. No respiratory distress. Rales: fine.  Abdominal: He exhibits no distension.  Neurological: He is alert.  Horizontal nystagmus appreciated with mild improvement worse when moving eyes vertically.    Skin: Skin is warm and dry.  Psychiatric: Thought content normal.    Assessment/Plan:   Vertigo: cause is still uncertain, dizziness and nausea/vomiting significantly improved after vestibular PT on HD#1 but have since returned and remains severe. Will continue vestibular PT. 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 repeat MRI shows no brainstem lesions but dentate nuclei hemorrhage, suggestive of cerebral amyloid angiopathy which may or may not be contributing to nystagmus  -Vestibular PT, pt using tilt bed -Meclizine 25 mg TID PRN -zofran PRN for nausea -confirmed that paraneoplastic autoantibody testing was sent off by our lab, wanted a repeat order placed because they could not see the specific tests that they wanted even though they were put into the comments section.  These studies are now pending. -PT recs: SNF   GERD/dyspepsia  -protonix 40mg   daily -GI cocktail PRN  Small cell carcinoma of upper lobe of right lung:biopsy results now with special staining suggestive of small cell lung cancer.    -Dr. Julien Nordmann saw patient yesterday evening, had a discussion with the patient and his son. -In our discussion with the pt and his sons the patient has decided to pursue comfort care and does not wish to undergo any chemotherapy or radiation treatments.     COPD w/ Panlobular emphysema: On rescue inhaler and flovent at home  -Anoro for maintenance therapy  -duonebs PRN Q8  PAF: diagnosed this admission  -continue diltiazem 24 hr 120 mg daily, eliquis 5mg  BID  BPH  -Continuehome doxazosin and finasteride daily  Constipation: pt immobilized for long period, has had poor oral intake.  -continue osmotic laxatives     Dispo: Anticipated discharge in 1-2 days, we are pursuing residential hospice vs SNF with hospice care with a preference to the former.   [name], MD 05/15/2018, 9:46 AM [name] MD PGY-1 Internal Medicine Pager # [phone]

## 2018-05-15 NOTE — Progress Notes (Signed)
CSW received referral for residential hospice placement. CSW spoke with patient's two sons at bedside. They have requested that patient be evaluated for [facility] as they wish for patient to be comfortable. CSW sent referral to [facility] to evaluate if the patient's prognosis meets their criteria.   Reginald Locus [name] LCSW [phone]

## 2018-05-15 NOTE — Progress Notes (Signed)
[facility] RN note.  Received request from Cedric Fishman, [place] for family interest in [facility] with request for transfer today . Chart reviewed and patient approved. Met with sons, Cathlean Cower and Toribio Harbour and wife, Arbie Cookey to confirm interest and explain services. Family agreeable to transfer today. Percell Locus, [place] aware.  Registration paper work completed.. Dr. Orpah Melter to assume care per family request. Please fax discharge summary to [phone]. RN please call report to [phone]. Please arrange transport for patient to arrive as soon as  Possible.  Thank you.   Farrel Gordon, RN, [facility] Liaison  [place] are on AMION

## 2018-05-15 NOTE — Progress Notes (Signed)
Medicine attending: I examined this patient today together with resident physician Dr. Guadlupe Spanish and I concur with his evaluation and management plan. We greatly appreciate the late night consultation by medical oncology.  All of the issues are very well framed and reviewed in his note. The patient has decided he would prefer a more palliative approach and has declined chemotherapy.  He is alert, awake, oriented, and fully cognizant of his choice.  Both of his sons are present and participated in the discussion and want to respect his wishes. We will contact our hospice liaison.  He would best be served by transfer to a residential hospice facility such as [facility].  We reassured him that we would continue to make sure that he gets high-quality comfort care. Atrial fibrillation heart rate well controlled on current medication regimen.  Most recent cardiogram done on June 10 showed that he was back in sinus rhythm. Laboratory studies today are stable and at his baseline.  Improved renal function with gentle hydration.

## 2018-05-15 NOTE — Progress Notes (Signed)
Nsg Discharge Note  Admit Date:  04/29/2018 Discharge date: 05/15/2018   [name] to be D/C'd to [facility] per MD order.  AVS completed.  Copy for chart, and copy for patient signed, and dated. Patient/caregiver able to verbalize understanding.  Discharge Medication: Allergies as of 05/15/2018   No Known Allergies     Medication List    STOP taking these medications   aspirin 325 MG tablet   ciprofloxacin 500 MG tablet Commonly known as:  CIPRO   fluticasone 44 MCG/ACT inhaler Commonly known as:  FLOVENT HFA   TOPAMAX 50 MG tablet Generic drug:  topiramate     TAKE these medications   albuterol 108 (90 Base) MCG/ACT inhaler Commonly known as:  PROVENTIL HFA;VENTOLIN HFA Inhale 2 puffs into the lungs every 4 (four) hours as needed for shortness of breath.   alum & mag hydroxide-simeth 200-200-20 MG/5ML suspension Commonly known as:  MAALOX/MYLANTA Take 15 mLs by mouth every 6 (six) hours as needed for indigestion or heartburn.   apixaban 5 MG Tabs tablet Commonly known as:  ELIQUIS Take 1 tablet (5 mg total) by mouth 2 (two) times daily.   calcium carbonate 500 MG chewable tablet Commonly known as:  TUMS - dosed in mg elemental calcium Chew 1-2 tablets (200-400 mg of elemental calcium total) by mouth 3 (three) times daily as needed for indigestion or heartburn.   clobetasol cream 0.05 % Commonly known as:  TEMOVATE Apply 1 application topically 2 (two) times daily as needed (for dry skin).   diltiazem 120 MG 24 hr capsule Commonly known as:  CARDIZEM CD Take 1 capsule (120 mg total) by mouth daily.   doxazosin 4 MG tablet Commonly known as:  CARDURA Take 4 mg by mouth at bedtime.   feeding supplement (ENSURE ENLIVE) Liqd Take 237 mLs by mouth 2 (two) times daily between meals.   finasteride 5 MG tablet Commonly known as:  PROSCAR Take 5 mg by mouth at bedtime.   fluticasone 50 MCG/ACT nasal spray Commonly known as:  FLONASE Place 2 sprays into  both nostrils daily.   meclizine 25 MG tablet Commonly known as:  ANTIVERT Take 1 tablet (25 mg total) by mouth 3 (three) times daily as needed for dizziness.   methocarbamol 500 MG tablet Commonly known as:  ROBAXIN Take 500 mg by mouth as needed for muscle spasms (Muscle spasms).   mouth rinse Liqd solution 15 mLs by Mouth Rinse route 2 (two) times daily.   nystatin 100000 UNIT/ML suspension Commonly known as:  MYCOSTATIN Take 5 mLs (500,000 Units total) by mouth 4 (four) times daily.   ondansetron 4 MG tablet Commonly known as:  ZOFRAN Take 4 mg by mouth daily as needed.   pantoprazole 40 MG tablet Commonly known as:  PROTONIX Take 1 tablet (40 mg total) by mouth daily.   polyethylene glycol packet Commonly known as:  MIRALAX / GLYCOLAX Take 17 g by mouth daily.   senna-docusate 8.6-50 MG tablet Commonly known as:  Senokot-S Take 1-2 tablets by mouth 2 (two) times daily as needed for mild constipation or moderate constipation.   triamcinolone ointment 0.1 % Commonly known as:  KENALOG Apply 1 application topically as needed (for dry skin).   umeclidinium-vilanterol 62.5-25 MCG/INH Aepb Commonly known as:  ANORO ELLIPTA Inhale 1 puff into the lungs daily. Start taking on:  05/16/2018       Discharge Assessment: Vitals:   05/14/18 2126 05/15/18 0534  BP: (!) 150/77 139/81  Pulse:  87 83  Resp: 20 18  Temp: 98.4 F (36.9 C)   SpO2: (!) 88% 96%   Skin clean, dry and intact without evidence of skin break down, no evidence of skin tears noted. IV catheter discontinued intact. Site without signs and symptoms of complications - no redness or edema noted at insertion site, patient denies c/o pain - only slight tenderness at site.  Dressing with slight pressure applied.  D/c Instructions-Education: Discharge instructions given to patient/family with verbalized understanding. D/c education completed with patient/family including follow up instructions, medication  list, d/c activities limitations if indicated, with other d/c instructions as indicated by MD - patient able to verbalize understanding, all questions fully answered.  Patient instructed to return to ED, call 911, or call MD for any changes in condition.  Patient escorted paramedics, and D/C to [facility].  Eliezer Champagne, RN 05/15/2018 7:42 PM

## 2018-05-28 LAB — MISC LABCORP TEST (SEND OUT): LABCORP TEST CODE: 9985

## 2018-06-02 DEATH — deceased
